# Patient Record
Sex: Male | Born: 1970 | Race: White | Hispanic: No | Marital: Married | Smoking: Current every day smoker
Health system: Southern US, Community
[De-identification: ages and names within clinical notes are randomized; demographics above are authoritative.]

## PROBLEM LIST (undated history)

## (undated) DIAGNOSIS — IMO0001 Reserved for inherently not codable concepts without codable children: Secondary | ICD-10-CM

## (undated) DIAGNOSIS — S53106A Unspecified dislocation of unspecified ulnohumeral joint, initial encounter: Secondary | ICD-10-CM

## (undated) DIAGNOSIS — F419 Anxiety disorder, unspecified: Secondary | ICD-10-CM

## (undated) DIAGNOSIS — J45909 Unspecified asthma, uncomplicated: Secondary | ICD-10-CM

## (undated) DIAGNOSIS — I10 Essential (primary) hypertension: Secondary | ICD-10-CM

## (undated) DIAGNOSIS — E7800 Pure hypercholesterolemia, unspecified: Secondary | ICD-10-CM

## (undated) DIAGNOSIS — R748 Abnormal levels of other serum enzymes: Secondary | ICD-10-CM

## (undated) DIAGNOSIS — T7840XA Allergy, unspecified, initial encounter: Secondary | ICD-10-CM

## (undated) DIAGNOSIS — F1910 Other psychoactive substance abuse, uncomplicated: Secondary | ICD-10-CM

## (undated) HISTORY — DX: Unspecified asthma, uncomplicated: J45.909

## (undated) HISTORY — DX: Other psychoactive substance abuse, uncomplicated: F19.10

## (undated) HISTORY — DX: Allergy, unspecified, initial encounter: T78.40XA

## (undated) HISTORY — DX: Abnormal levels of other serum enzymes: R74.8

## (undated) HISTORY — DX: Essential (primary) hypertension: I10

---
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
## 2005-01-17 ENCOUNTER — Emergency Department: Payer: Self-pay | Admitting: Unknown Physician Specialty

## 2006-12-14 ENCOUNTER — Ambulatory Visit: Payer: Self-pay | Admitting: Otolaryngology

## 2009-12-31 ENCOUNTER — Ambulatory Visit: Payer: Self-pay

## 2010-01-25 HISTORY — PX: TYMPANOMASTOIDECTOMY WITH RECONSTRUCTION: SHX5679

## 2010-02-19 ENCOUNTER — Ambulatory Visit: Payer: Self-pay | Admitting: Otolaryngology

## 2010-03-11 ENCOUNTER — Ambulatory Visit: Payer: Self-pay | Admitting: Otolaryngology

## 2012-02-25 ENCOUNTER — Ambulatory Visit: Payer: Self-pay | Admitting: Family Medicine

## 2012-06-28 ENCOUNTER — Ambulatory Visit: Payer: Self-pay | Admitting: Otolaryngology

## 2012-10-16 IMAGING — CT CT ORBITS WITHOUT CONTRAST
3 of 4 series · 17 of 30 positions shown, 19 images · non-contrast
Comparison: none

REASON FOR EXAM: Cholesteatoma Left Compare to Previous CT November 2006
COMMENTS:

PROCEDURE:     CT  - CT ORBITS OR TEMPORAL BONE WO  - February 19, 2010  [DATE]
RESULT:     Comparison is made to a prior study dated 02/12/2006.
TECHNIQUE: Axial and coronal imaging of the temporal bones was obtained
utilizing helical 0.75 mm acquisition and bone reconstruction algorithm.

[Series 5: left coronal temp bone · axial · 0.20mm/px · z∈[+283,+324]mm · 6 of 78 slices shown, 8 images]
[im 12/78  brain]
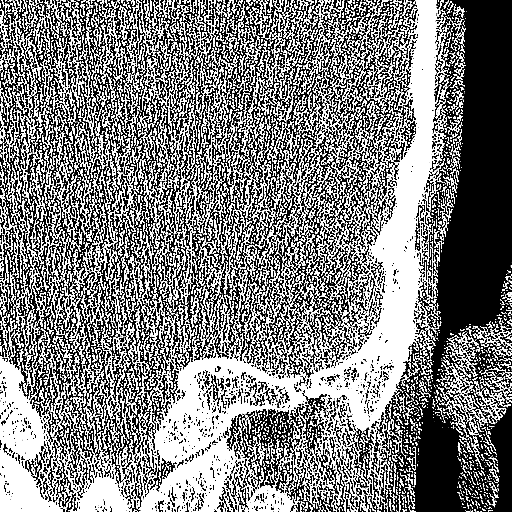
[im 12/78  bone]
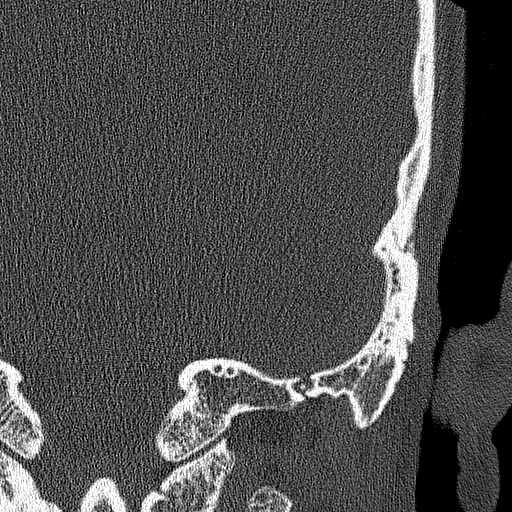
[im 23/78  bone]
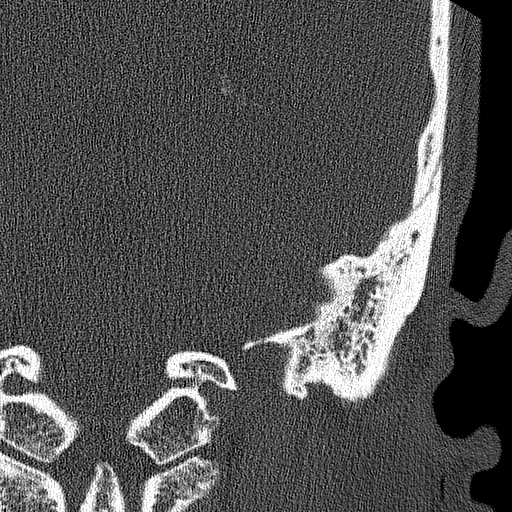
[im 34/78  bone]
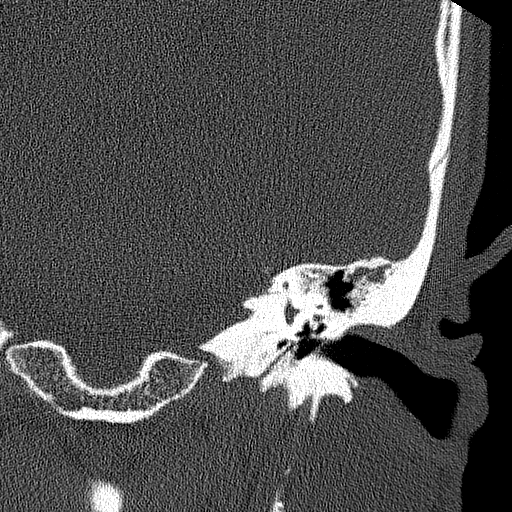
[im 45/78  bone]
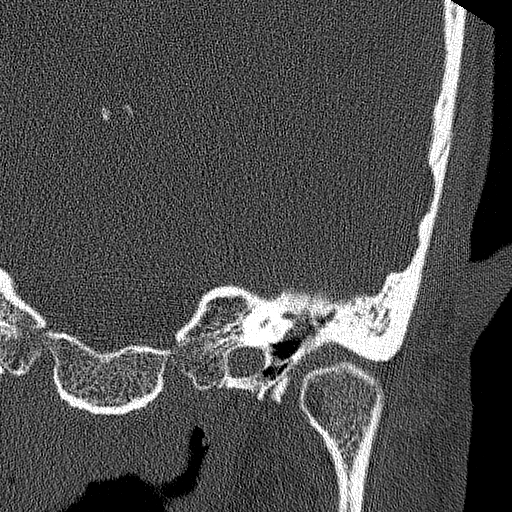
[im 56/78  brain]
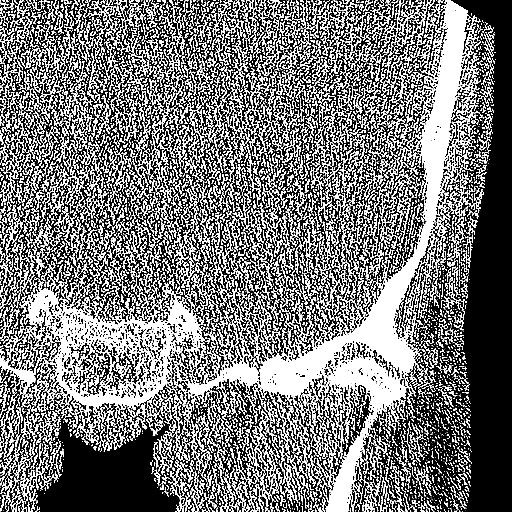
[im 56/78  bone]
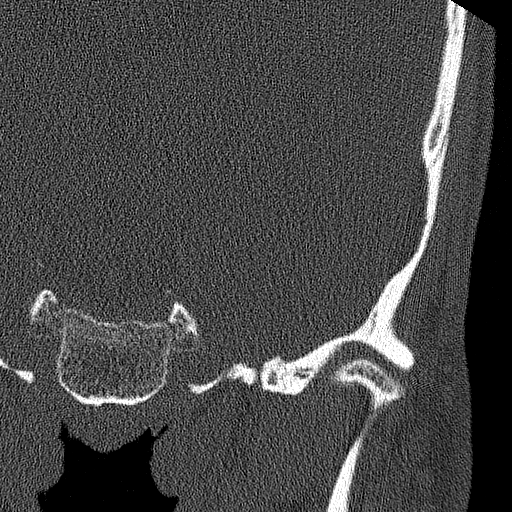
[im 67/78  bone]
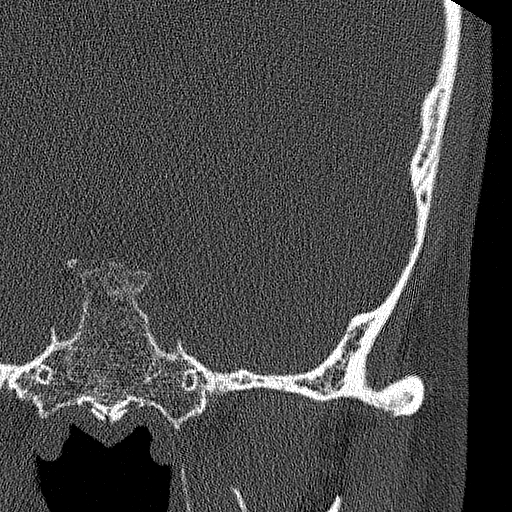

[Series 6: right coronal temp bone · axial · 0.20mm/px · z∈[+283,+324]mm · 6 of 78 slices shown]
[im 12/78  bone]
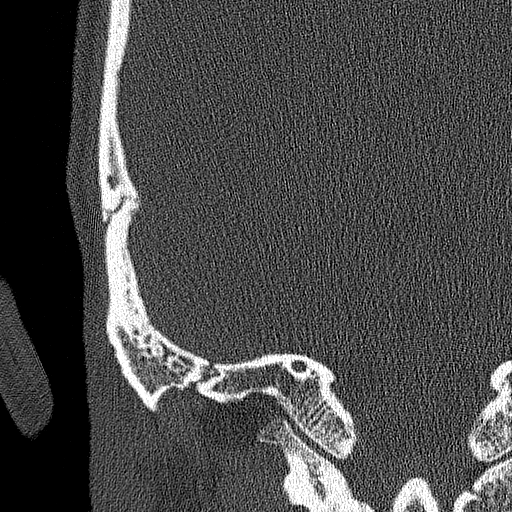
[im 23/78  bone]
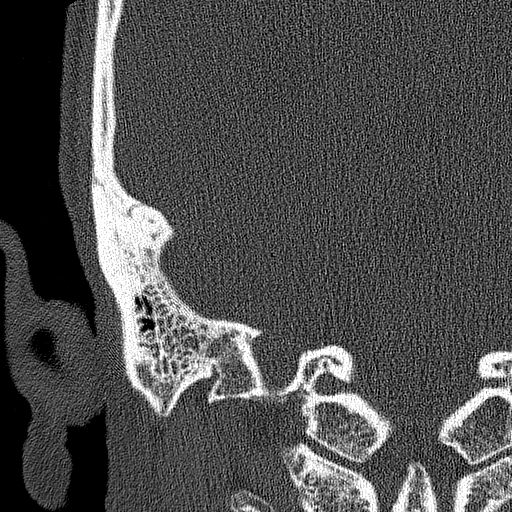
[im 34/78  bone]
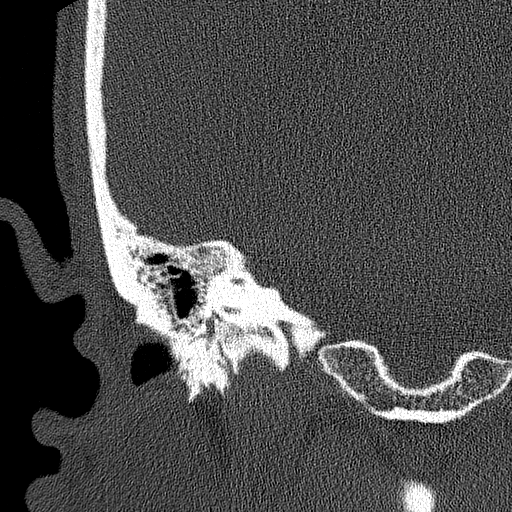
[im 45/78  bone]
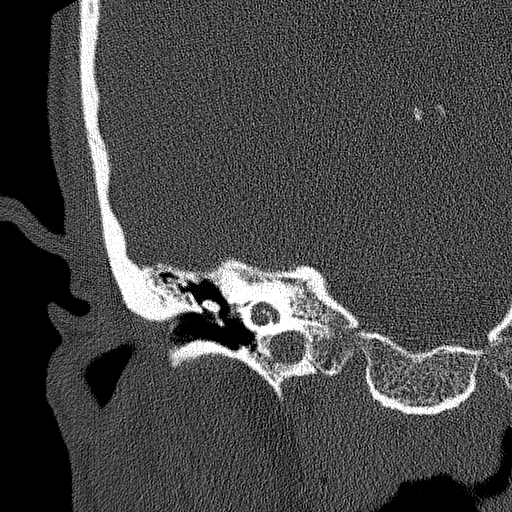
[im 56/78  bone]
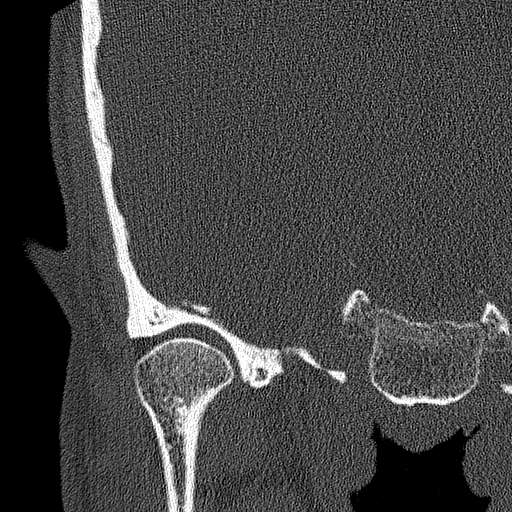
[im 67/78  bone]
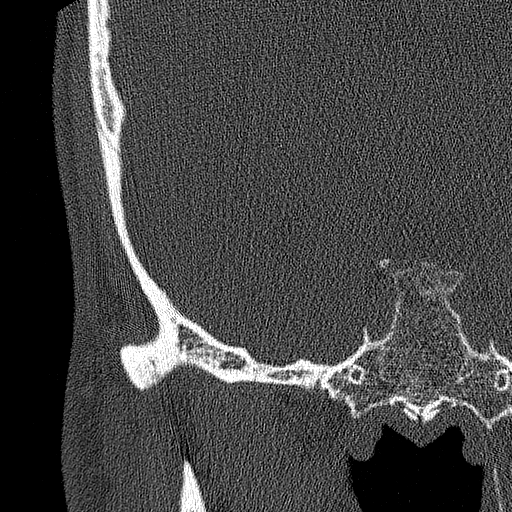

[Series 7: right axial temp bones · axial · 0.20mm/px · z∈[+331,+363]mm · 5 of 70 slices shown]
[im 12/70  bone]
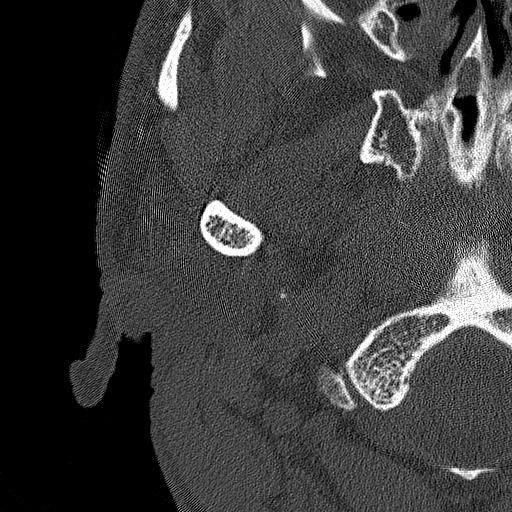
[im 24/70  bone]
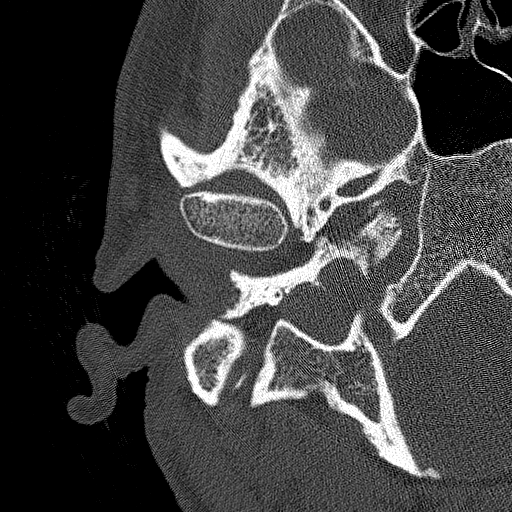
[im 35/70  bone]
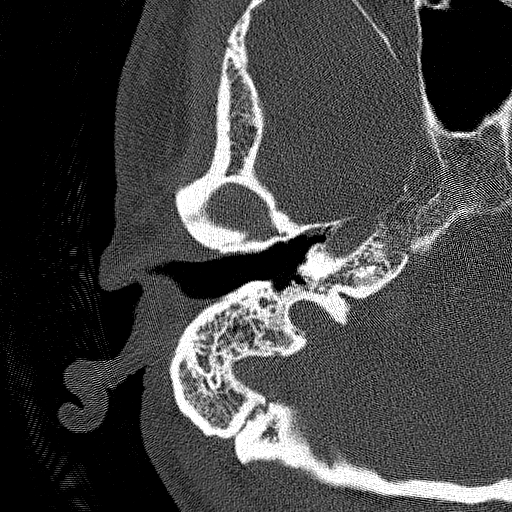
[im 47/70  bone]
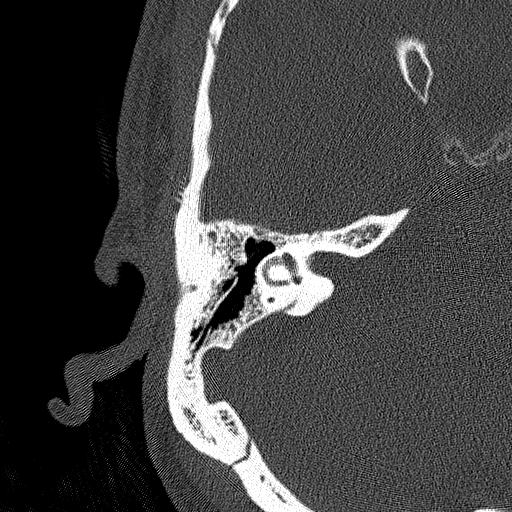
[im 58/70  bone]
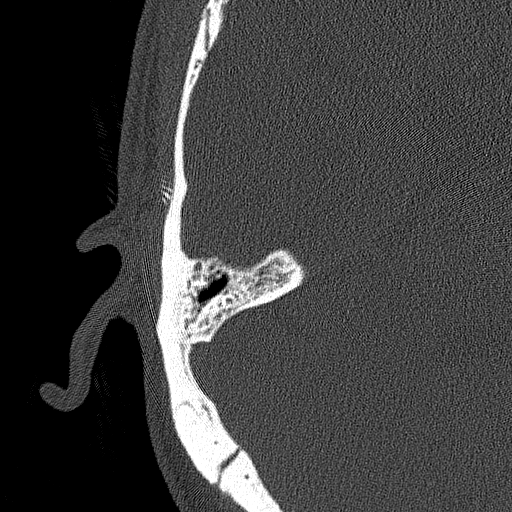

[17 of 30 positions shown; findings below may reference images not displayed]

FINDINGS: Evaluation of the right temporal bone once again demonstrates a
small area of soft tissue attenuation projecting within the medial aspect of
the middle ear cavity adjacent to the cochlear. The ossicles appear intact
as well as the tympanic membrane. There does not appear to be evidence of
scutal erosion. The tegmen is intact. There are mild soft tissue densities
within the mastoid air cells.

Evaluation of the left tympanic membrane once again demonstrates soft tissue
attenuation within the middle air cavity. This extends into the epitympanic
region. Traction of the tympanic membrane is identified as well as evidence
of mild to moderate scutal erosion. These findings are unchanged. There is
mild ossicular irregularity which again is unchanged. The tegmen is
unchanged. Areas of soft tissue attenuation are also appreciated within the
mastoid air cells. These findings are unchanged.
IMPRESSION: Areas of soft tissue density once again are appreciated within the right and
left middle ear cavities again concerning for cholesteatomas. These findings
are unchanged when compared to the previous study dated 12/14/2006.

## 2013-01-25 HISTORY — PX: ANTERIOR CRUCIATE LIGAMENT REPAIR: SHX115

## 2013-04-11 DIAGNOSIS — T8489XA Other specified complication of internal orthopedic prosthetic devices, implants and grafts, initial encounter: Secondary | ICD-10-CM | POA: Insufficient documentation

## 2013-05-30 ENCOUNTER — Encounter: Payer: Self-pay | Admitting: Orthopedic Surgery

## 2013-06-25 ENCOUNTER — Encounter: Payer: Self-pay | Admitting: Orthopedic Surgery

## 2013-07-25 ENCOUNTER — Encounter: Payer: Self-pay | Admitting: Orthopedic Surgery

## 2013-08-25 ENCOUNTER — Encounter: Payer: Self-pay | Admitting: Orthopedic Surgery

## 2014-07-13 ENCOUNTER — Other Ambulatory Visit: Payer: Self-pay | Admitting: Family Medicine

## 2014-09-16 DIAGNOSIS — T85848A Pain due to other internal prosthetic devices, implants and grafts, initial encounter: Secondary | ICD-10-CM | POA: Insufficient documentation

## 2014-09-25 ENCOUNTER — Other Ambulatory Visit: Payer: Self-pay | Admitting: Family Medicine

## 2014-10-10 ENCOUNTER — Telehealth: Payer: Self-pay | Admitting: Family Medicine

## 2014-10-10 NOTE — Telephone Encounter (Signed)
Symb[facility], Pt has no more refills.

## 2014-10-22 IMAGING — CR DG CHEST 2V
1 series · 2 of 2 positions shown · non-contrast
Comparison: none

REASON FOR EXAM: cough
COMMENTS:

[Series 1: pa · 0.17mm/px · 2 of 2 slices shown]
[im 1/2]
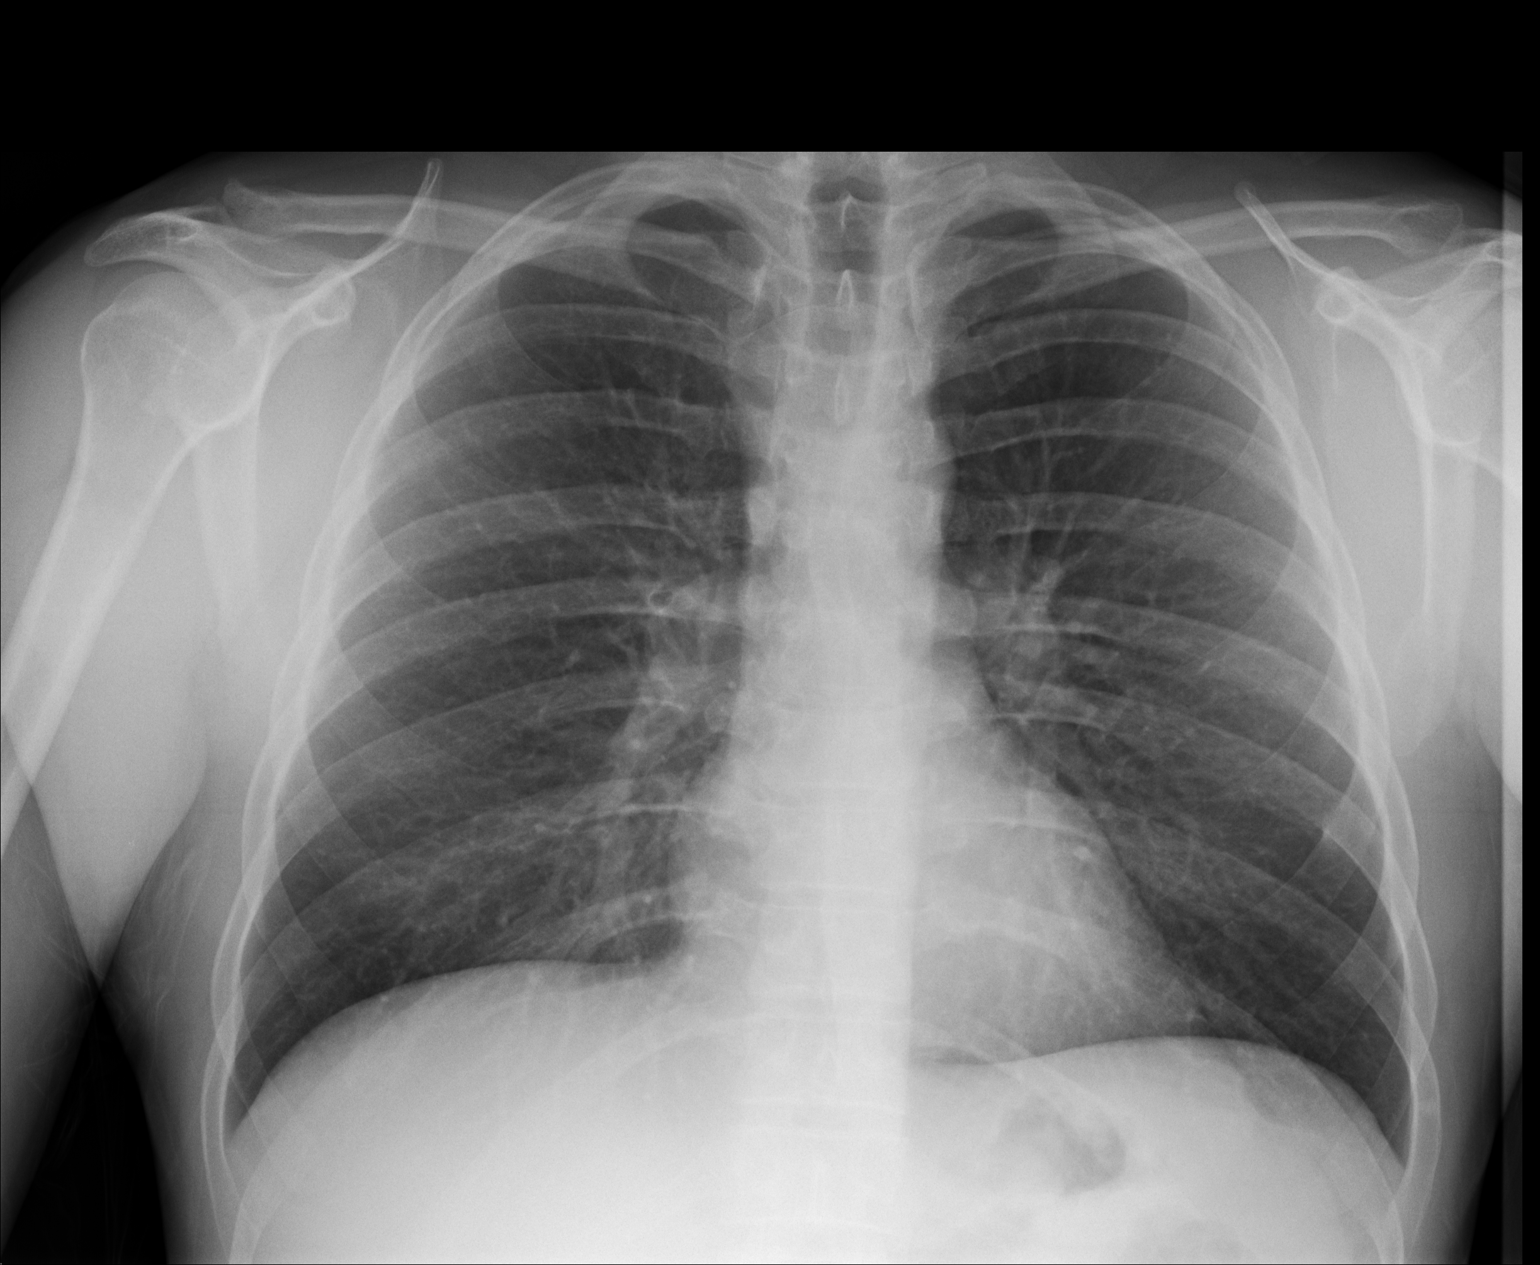
[im 2/2]
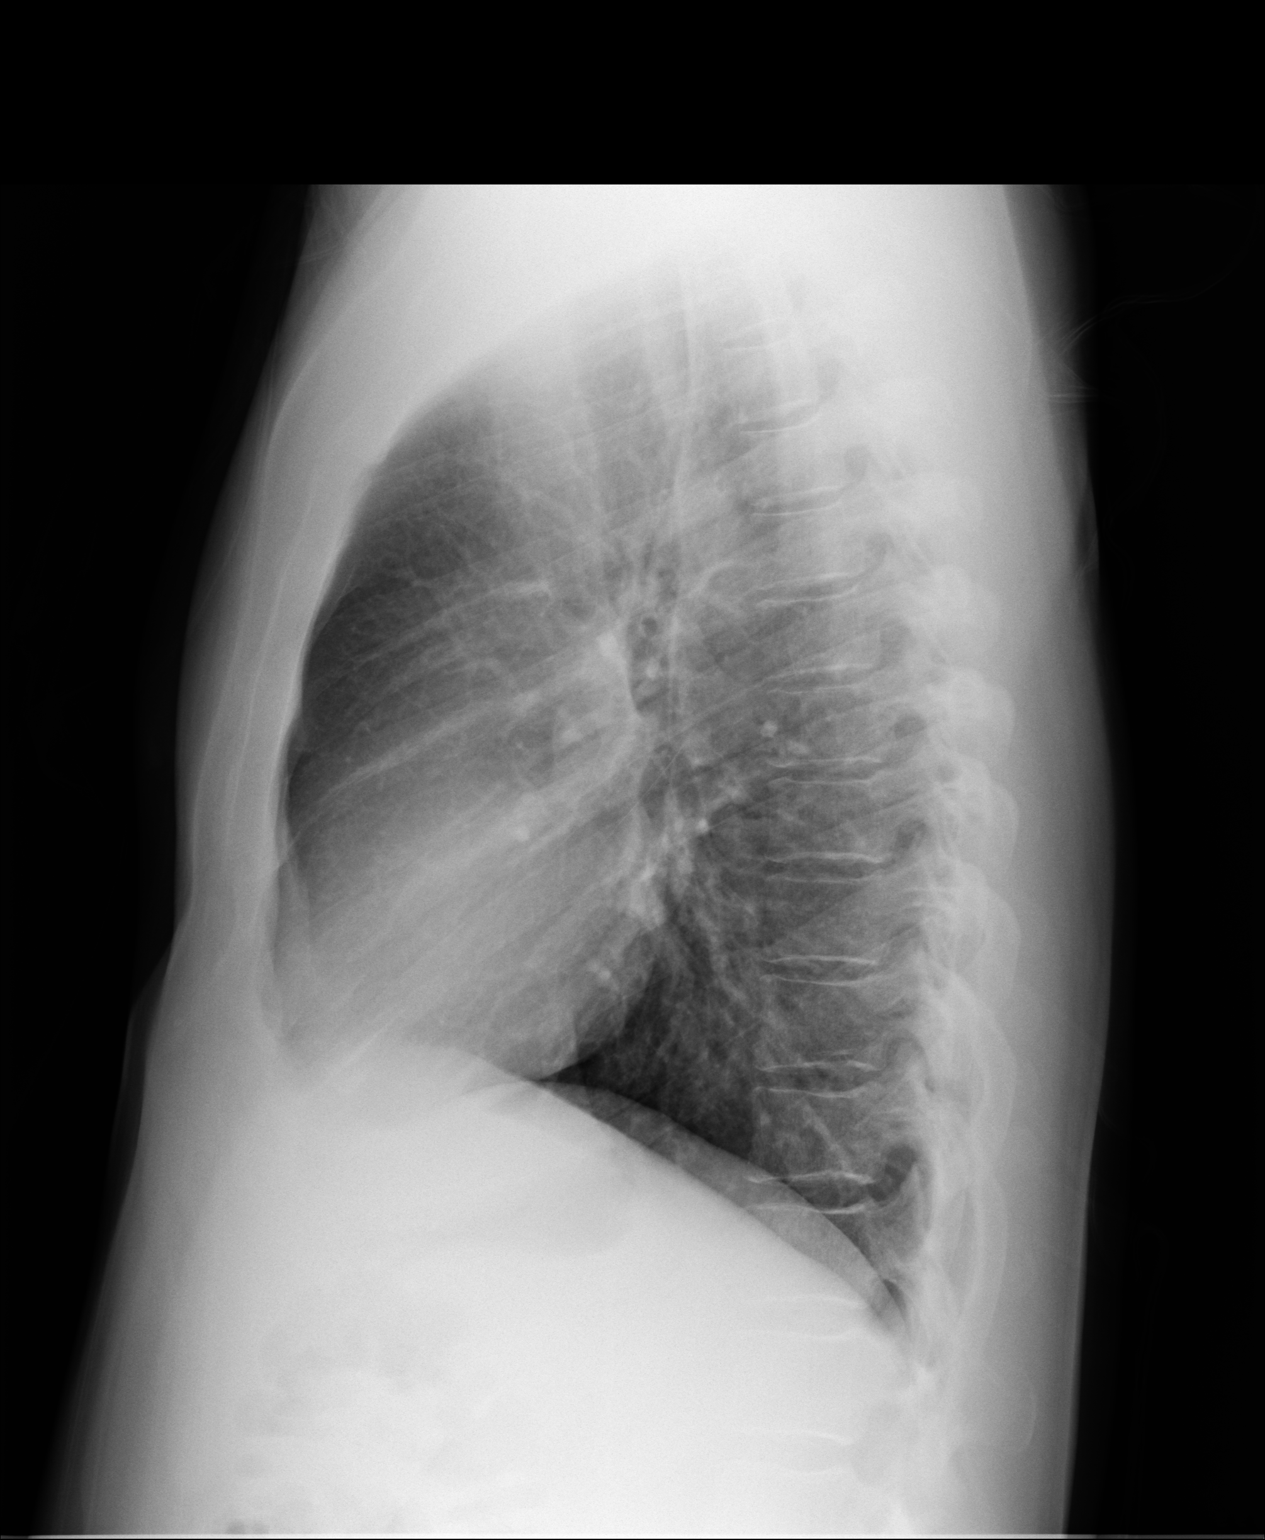

[2 of 2 positions shown; findings below may reference images not displayed]

PROCEDURE:     THACKER - THACKER CHEST PA (OR AP) AND LAT  - February 25, 2012 [DATE]

RESULT:     The lungs are mildly hyperinflated. There is no focal
infiltrate. The cardiac silhouette is normal in size. The mediastinum is
normal in width. The perihilar lung markings are mildly prominent. There is
no pleural effusion. The pulmonary vascularity is not engorged. The bony
structures exhibit no acute abnormalities.
IMPRESSION: There is no evidence of pneumonia. I cannot exclude acute
bronchitis superimposed upon reactive airway disease or COPD in the
appropriate clinical setting.

[REDACTED]

## 2014-11-20 ENCOUNTER — Encounter: Payer: Self-pay | Admitting: Family Medicine

## 2014-11-20 ENCOUNTER — Ambulatory Visit (INDEPENDENT_AMBULATORY_CARE_PROVIDER_SITE_OTHER): Payer: BLUE CROSS/BLUE SHIELD | Admitting: Family Medicine

## 2014-11-20 VITALS — BP 118/74 | HR 64 | Temp 98.0°F | Ht 66.6 in | Wt 149.0 lb

## 2014-11-20 DIAGNOSIS — J45909 Unspecified asthma, uncomplicated: Secondary | ICD-10-CM | POA: Diagnosis not present

## 2014-11-20 DIAGNOSIS — M545 Low back pain, unspecified: Secondary | ICD-10-CM | POA: Insufficient documentation

## 2014-11-20 LAB — URINALYSIS, ROUTINE W REFLEX MICROSCOPIC
Bilirubin, UA: NEGATIVE
Glucose, UA: NEGATIVE
KETONES UA: NEGATIVE
LEUKOCYTES UA: NEGATIVE
Nitrite, UA: NEGATIVE
Protein, UA: NEGATIVE
RBC, UA: NEGATIVE
Specific Gravity, UA: 1.025 (ref 1.005–1.030)
Urobilinogen, Ur: 0.2 mg/dL (ref 0.2–1.0)
pH, UA: 5.5 (ref 5.0–7.5)

## 2014-11-20 MED ORDER — ALBUTEROL SULFATE HFA 108 (90 BASE) MCG/ACT IN AERS: 2.0000 | INHALATION_SPRAY | Freq: Four times a day (QID) | RESPIRATORY_TRACT | Status: DC | PRN

## 2014-11-20 MED ORDER — MELOXICAM 15 MG PO TABS: 15.0000 mg | ORAL_TABLET | Freq: Every day | ORAL | Status: DC

## 2014-11-20 MED ORDER — CYCLOBENZAPRINE HCL 5 MG PO TABS: 5.0000 mg | ORAL_TABLET | Freq: Three times a day (TID) | ORAL | Status: DC | PRN

## 2014-11-20 NOTE — Progress Notes (Signed)
BP 118/74 mmHg  Pulse 64  Temp(Src) 98 F (36.7 C)  Ht 5' 6.6" (1.692 m)  Wt 149 lb (67.586 kg)  BMI 23.61 kg/m2  SpO2 97%   Subjective:    Patient ID: Daniel Lawson, male    DOB: [birth year], 44 y.o.   MRN: [id number]  HPI: Daniel Lawson is a 44 y.o. male  Chief Complaint  Patient presents with  . Medication Refill  . Back Pain   patient with several concerns Asthma attack with severe coughing wheezing hasn't had any for years wants to have an inhaler. Since has done fine did not have any known triggers. 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 Patient also has low back pain no radiation worse with bending and stooping coming up from lifting. No blood in stool or urine.   Relevant past medical, surgical, family and social history reviewed and updated as indicated. Interim medical history since our last visit reviewed. Allergies and medications reviewed and updated.  Review of Systems  Constitutional: Negative.   Respiratory: Negative.   Cardiovascular: Negative.     Per HPI unless specifically indicated above     Objective:    BP 118/74 mmHg  Pulse 64  Temp(Src) 98 F (36.7 C)  Ht 5' 6.6" (1.692 m)  Wt 149 lb (67.586 kg)  BMI 23.61 kg/m2  SpO2 97%  Wt Readings from Last 3 Encounters:  11/20/14 149 lb (67.586 kg)    Physical Exam  Constitutional: He is oriented to person, place, and time. He appears well-developed and well-nourished. No distress.  HENT:  Head: Normocephalic and atraumatic.  Right Ear: Hearing normal.  Left Ear: Hearing normal.  Nose: Nose normal.  Eyes: Conjunctivae and lids are normal. Right eye exhibits no discharge. Left eye exhibits no discharge. No scleral icterus.  Cardiovascular: Normal rate, regular rhythm and normal heart sounds.   Pulmonary/Chest: Effort normal and breath sounds normal. No respiratory  distress.  Musculoskeletal: Normal range of motion.  Right trapezius area with resistance to activity able to light up trigger point. Low back with normal DTRs straight leg raising no neurological deficit but did with bending aggravate low back Urinalysis was normal with no evidence of blood.  Neurological: He is alert and oriented to person, place, and time.  Skin: Skin is intact. No rash noted.  Psychiatric: He has a normal mood and affect. His speech is normal and behavior is normal. Judgment and thought content normal. Cognition and memory are normal.        Assessment & Plan:   Problem List Items Addressed This Visit      Respiratory   Asthma    Discuss asthma care triggers leading smoking Give prescription for albuterol inhaler      Relevant Medications   albuterol (PROVENTIL HFA;VENTOLIN HFA) 108 (90 BASE) MCG/ACT inhaler     Other   Right-sided low back pain without sciatica - Primary    Discussed trigger point care stretching Changing activities trying to use left hand for Discuss low back pain posture Proper lifting techniques sharing lifting duties Massage therapy Discuss use of muscle relaxers only at the end of the day due to drowsiness side effects May use meloxicam sparingly      Relevant Medications   meloxicam (MOBIC) 15 MG tablet   cyclobenzaprine (FLEXERIL) 5 MG tablet   Other Relevant Orders  Urinalysis, Routine w reflex microscopic (not [facility])       Follow up plan: Return for Physical Exam in February.

## 2014-11-20 NOTE — Assessment & Plan Note (Signed)
Discuss asthma care triggers leading smoking Give prescription for albuterol inhaler

## 2014-11-20 NOTE — Assessment & Plan Note (Signed)
Discussed trigger point care stretching Changing activities trying to use left hand for Discuss low back pain posture Proper lifting techniques sharing lifting duties Massage therapy Discuss use of muscle relaxers only at the end of the day due to drowsiness side effects May use meloxicam sparingly

## 2015-01-21 ENCOUNTER — Encounter: Payer: Self-pay | Admitting: Family Medicine

## 2015-01-21 ENCOUNTER — Ambulatory Visit (INDEPENDENT_AMBULATORY_CARE_PROVIDER_SITE_OTHER): Payer: BLUE CROSS/BLUE SHIELD | Admitting: Family Medicine

## 2015-01-21 VITALS — BP 126/75 | HR 66 | Temp 98.1°F | Resp 16 | Ht 66.5 in | Wt 151.0 lb

## 2015-01-21 DIAGNOSIS — R059 Cough, unspecified: Secondary | ICD-10-CM

## 2015-01-21 DIAGNOSIS — R509 Fever, unspecified: Secondary | ICD-10-CM

## 2015-01-21 DIAGNOSIS — R5383 Other fatigue: Secondary | ICD-10-CM | POA: Diagnosis not present

## 2015-01-21 DIAGNOSIS — J111 Influenza due to unidentified influenza virus with other respiratory manifestations: Secondary | ICD-10-CM

## 2015-01-21 DIAGNOSIS — R05 Cough: Secondary | ICD-10-CM | POA: Insufficient documentation

## 2015-01-21 DIAGNOSIS — R69 Illness, unspecified: Secondary | ICD-10-CM

## 2015-01-21 LAB — POC INFLUENZA A&B (BINAX/QUICKVUE)
Influenza A, POC: NEGATIVE
Influenza B, POC: NEGATIVE

## 2015-01-21 MED ORDER — AZITHROMYCIN 250 MG PO TABS: ORAL_TABLET | ORAL | Status: DC

## 2015-01-21 MED ORDER — DM-GUAIFENESIN ER 30-600 MG PO TB12: 1.0000 | ORAL_TABLET | Freq: Two times a day (BID) | ORAL | Status: DC | PRN

## 2015-01-21 MED ORDER — OSELTAMIVIR PHOSPHATE 75 MG PO CAPS: 75.0000 mg | ORAL_CAPSULE | Freq: Two times a day (BID) | ORAL | Status: DC

## 2015-01-21 NOTE — Progress Notes (Signed)
Name: Daniel Lawson   MRN: [id number]    DOB: 04/26/1970   Date:01/21/2015       Progress Note  Subjective  Chief Complaint  Chief Complaint  Patient presents with  . Acute Visit    Flu    HPI 3 days of fatigue, aches, chills, fever, headache, ear pain, cough (non-productive), and mild sore throat.  Sudden onset.  He did not get a flu shot this year.  No problem-specific assessment & plan notes found for this encounter.   Past Medical History  Diagnosis Date  . Substance abuse     alcohol  . Elevated liver enzymes   . Hypertension   . Allergy   . Asthma     Social History  Substance Use Topics  . Smoking status: Current Every Day Smoker    Types: Cigarettes  . Smokeless tobacco: Never Used  . Alcohol Use: No     Current outpatient prescriptions:  .  albuterol (PROVENTIL HFA;VENTOLIN HFA) 108 (90 BASE) MCG/ACT inhaler, Inhale 2 puffs into the lungs every 6 (six) hours as needed for wheezing or shortness of breath., Disp: 1 Inhaler, Rfl: 0 .  cyclobenzaprine (FLEXERIL) 5 MG tablet, Take 1 tablet (5 mg total) by mouth 3 (three) times daily as needed for muscle spasms., Disp: 30 tablet, Rfl: 1 .  FLUoxetine (PROZAC) 20 MG tablet, TAKE ONE TABLET BY MOUTH ONCE DAILY, Disp: 30 tablet, Rfl: 6 .  meloxicam (MOBIC) 15 MG tablet, Take 1 tablet (15 mg total) by mouth daily., Disp: 30 tablet, Rfl: 3  No Known Allergies  Review of Systems  Constitutional: Positive for fever, chills and malaise/fatigue. Negative for weight loss.  HENT: Negative for hearing loss.   Eyes: Negative for blurred vision and double vision.  Respiratory: Positive for cough. Negative for shortness of breath and wheezing.   Cardiovascular: Negative for chest pain, palpitations and leg swelling.  Gastrointestinal: Negative for heartburn, abdominal pain and blood in stool.  Genitourinary: Negative for dysuria, urgency and frequency.  Musculoskeletal: Positive for myalgias. Negative for joint pain.   Skin: Negative for rash.  Neurological: Positive for weakness and headaches. Negative for dizziness and tremors.      Objective  Filed Vitals:   01/21/15 0957  BP: 126/75  Pulse: 66  Temp: 98.1 F (36.7 C)  TempSrc: Oral  Resp: 16  Height: 5' 6.5" (1.689 m)  Weight: 151 lb (68.493 kg)     Physical Exam  Constitutional: He is oriented to person, place, and time. He appears distressed (Appears to feel ill.).  HENT:  Head: Normocephalic and atraumatic.  Right Ear: External ear normal.  Left Ear: External ear normal.  Nose: Nose normal.  Mouth/Throat: Oropharynx is clear and moist.  Neck: Normal range of motion. Neck supple. Carotid bruit is not present. No thyromegaly present.  Cardiovascular: Normal rate, regular rhythm and normal heart sounds.  Exam reveals no gallop and no friction rub.   No murmur heard. Pulmonary/Chest: Effort normal. No respiratory distress. He has wheezes. He has no rales.  Lymphadenopathy:    He has no cervical adenopathy.  Neurological: He is alert and oriented to person, place, and time.  Vitals reviewed.     Recent Results (from the past 2160 hour(s))  Urinalysis, Routine w reflex microscopic (not at [facility])     Status: None   Collection Time: 11/20/14 10:37 AM  Result Value Ref Range   Specific Gravity, UA 1.025 1.005 - 1.030   pH, UA 5.5 5.0 -  7.5   Color, UA Yellow Yellow   Appearance Ur Clear Clear   Leukocytes, UA Negative Negative   Protein, UA Negative Negative/Trace   Glucose, UA Negative Negative   Ketones, UA Negative Negative   RBC, UA Negative Negative   Bilirubin, UA Negative Negative   Urobilinogen, Ur 0.2 0.2 - 1.0 mg/dL   Nitrite, UA Negative Negative  POC Influenza A&B (Binax test)     Status: Normal   Collection Time: 01/21/15 10:33 AM  Result Value Ref Range   Influenza A, POC Negative Negative   Influenza B, POC Negative Negative     Assessment & Plan  1. Fever and chills  - POC Influenza A&B (Binax  test)-neg 2. Influenza-like illness  - oseltamivir (TAMIFLU) 75 MG capsule; Take 1 capsule (75 mg total) by mouth 2 (two) times daily.  Dispense: 10 capsule; Refill: 0  3. Cough   - azithromycin (ZITHROMAX) 250 MG tablet; Take 2 tabs on day 1, then 1 tab daily on days 2-5  Dispense: 6 tablet; Refill: 0 - dextromethorphan-guaiFENesin (MUCINEX DM) 30-600 MG 12hr tablet; Take 1 tablet by mouth 2 (two) times daily as needed for cough.  Dispense: 20 tablet; Refill: 1  4. Other fatigue

## 2015-01-29 ENCOUNTER — Other Ambulatory Visit: Payer: Self-pay

## 2015-01-29 MED ORDER — FLUOXETINE HCL 20 MG PO TABS: 20.0000 mg | ORAL_TABLET | Freq: Every day | ORAL | Status: DC

## 2015-01-29 NOTE — Telephone Encounter (Signed)
[facility]: Refill request W/ 90 day supply.  Last Visit: 11/20/2014 Next Appt: 03/19/2015  Request for fluoxetine 20mg  tablet #90

## 2015-03-19 ENCOUNTER — Ambulatory Visit
Admission: RE | Admit: 2015-03-19 | Discharge: 2015-03-19 | Disposition: A | Discharge status: Home / Self Care | Payer: BLUE CROSS/BLUE SHIELD | Source: Ambulatory Visit | Attending: Family Medicine | Admitting: Family Medicine

## 2015-03-19 ENCOUNTER — Ambulatory Visit (INDEPENDENT_AMBULATORY_CARE_PROVIDER_SITE_OTHER): Payer: BLUE CROSS/BLUE SHIELD | Admitting: Family Medicine

## 2015-03-19 ENCOUNTER — Encounter: Payer: Self-pay | Admitting: Family Medicine

## 2015-03-19 VITALS — BP 118/73 | HR 56 | Temp 98.3°F | Ht 66.6 in | Wt 154.0 lb

## 2015-03-19 DIAGNOSIS — Z0000 Encounter for general adult medical examination without abnormal findings: Secondary | ICD-10-CM

## 2015-03-19 DIAGNOSIS — Z113 Encounter for screening for infections with a predominantly sexual mode of transmission: Secondary | ICD-10-CM

## 2015-03-19 DIAGNOSIS — J45909 Unspecified asthma, uncomplicated: Secondary | ICD-10-CM | POA: Diagnosis not present

## 2015-03-19 DIAGNOSIS — F32A Depression, unspecified: Secondary | ICD-10-CM

## 2015-03-19 DIAGNOSIS — F329 Major depressive disorder, single episode, unspecified: Secondary | ICD-10-CM | POA: Diagnosis not present

## 2015-03-19 LAB — URINALYSIS, ROUTINE W REFLEX MICROSCOPIC
BILIRUBIN UA: NEGATIVE
GLUCOSE, UA: NEGATIVE
KETONES UA: NEGATIVE
Leukocytes, UA: NEGATIVE
Nitrite, UA: NEGATIVE
PROTEIN UA: NEGATIVE
RBC UA: NEGATIVE
SPEC GRAV UA: 1.01 (ref 1.005–1.030)
UUROB: 0.2 mg/dL (ref 0.2–1.0)
pH, UA: 7.5 (ref 5.0–7.5)

## 2015-03-19 MED ORDER — MELOXICAM 15 MG PO TABS: 15.0000 mg | ORAL_TABLET | Freq: Every day | ORAL | Status: DC

## 2015-03-19 MED ORDER — FLUOXETINE HCL 20 MG PO TABS: 20.0000 mg | ORAL_TABLET | Freq: Every day | ORAL | Status: DC

## 2015-03-19 MED ORDER — UMECLIDINIUM-VILANTEROL 62.5-25 MCG/INH IN AEPB: 1.0000 | INHALATION_SPRAY | Freq: Every day | RESPIRATORY_TRACT | Status: DC

## 2015-03-19 MED ORDER — ALBUTEROL SULFATE HFA 108 (90 BASE) MCG/ACT IN AERS: 2.0000 | INHALATION_SPRAY | Freq: Four times a day (QID) | RESPIRATORY_TRACT | Status: DC | PRN

## 2015-03-19 NOTE — Assessment & Plan Note (Signed)
The current medical regimen is effective;  continue present plan and medications.  

## 2015-03-19 NOTE — Addendum Note (Signed)
Addended by: Lurlean Horns on: 03/19/2015 09:57 AM   Modules accepted: Kipp Brood

## 2015-03-19 NOTE — Progress Notes (Signed)
BP 118/73 mmHg  Pulse 56  Temp(Src) 98.3 F (36.8 C)  Ht 5' 6.6" (1.692 m)  Wt 154 lb (69.854 kg)  BMI 24.40 kg/m2  SpO2 98%   Subjective:    Patient ID: Daniel Lawson, male    DOB: [birth year], 45 y.o.   MRN: [id number]  HPI: ZAKI Lawson is a 45 y.o. male  Chief Complaint  Patient presents with  . 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 Cough dry nonproductive, with some wheezing. Still smoking about half pack a day Takes fluoxetine 20 every day nerves doing well Taking meloxicam also without problems takes that every morning. Loosens up his muscles and back   Relevant past medical, surgical, family and social history reviewed and updated as indicated. Interim medical history since our last visit reviewed. Allergies and medications reviewed and updated.  Review of Systems  Constitutional: Negative.   HENT: Negative.   Eyes: Negative.   Respiratory: Negative.   Cardiovascular: Negative.   Gastrointestinal: Negative.   Endocrine: Negative.   Genitourinary: Negative.   Musculoskeletal: Negative.   Skin: Negative.   Allergic/Immunologic: Negative.   Neurological: Negative.   Hematological: Negative.   Psychiatric/Behavioral: Negative.     Per HPI unless specifically indicated above     Objective:    BP 118/73 mmHg  Pulse 56  Temp(Src) 98.3 F (36.8 C)  Ht 5' 6.6" (1.692 m)  Wt 154 lb (69.854 kg)  BMI 24.40 kg/m2  SpO2 98%  Wt Readings from Last 3 Encounters:  03/19/15 154 lb (69.854 kg)  01/21/15 151 lb (68.493 kg)  11/20/14 149 lb (67.586 kg)    Physical Exam  Constitutional: He is oriented to person, place, and time. He appears well-developed and well-nourished.  HENT:  Head: Normocephalic and atraumatic.  Right Ear: External ear normal.  Left Ear: External ear normal.  Eyes:  Conjunctivae and EOM are normal. Pupils are equal, round, and reactive to light.  Neck: Normal range of motion. Neck supple.  Cardiovascular: Normal rate, regular rhythm, normal heart sounds and intact distal pulses.   Pulmonary/Chest: Effort normal and breath sounds normal.  Abdominal: Soft. Bowel sounds are normal. There is no splenomegaly or hepatomegaly.  Genitourinary: Rectum normal, prostate normal and penis normal.  Musculoskeletal: Normal range of motion.  Neurological: He is alert and oriented to person, place, and time. He has normal reflexes.  Skin: No rash noted. No erythema.  Psychiatric: He has a normal mood and affect. His behavior is normal. Judgment and thought content normal.    Results for orders placed or performed in visit on 01/21/15  POC Influenza A&B (Binax test)  Result Value Ref Range   Influenza A, POC Negative Negative   Influenza B, POC Negative Negative      Assessment & Plan:   Problem List Items Addressed This Visit      Respiratory   Asthma    Patient with chronic cough for 6 months we'll get chest x-ray because of exposure to dusty environment with potential for asbestosis Will start Anoro patient had given      Relevant Medications   albuterol (PROVENTIL HFA;VENTOLIN HFA) 108 (90 Base) MCG/ACT inhaler   umeclidinium-vilanterol (ANORO ELLIPTA) 62.5-25 MCG/INH AEPB   Other Relevant Orders   DG Chest 2 View     Other   Depression  The current medical regimen is effective;  continue present plan and medications.       Relevant Medications   FLUoxetine (PROZAC) 20 MG tablet    Other Visit Diagnoses    Routine general medical examination at a health care facility    -  Primary    Relevant Orders    CBC with Differential/Platelet    Comprehensive metabolic panel    Lipid Panel w/o Chol/HDL Ratio    PSA    TSH    Urinalysis, Routine w reflex microscopic (not at [facility])    Routine screening for STI (sexually transmitted infection)         Relevant Orders    HIV antibody        Follow up plan: Return in about 6 months (around 09/16/2015) for med check.

## 2015-03-19 NOTE — Assessment & Plan Note (Signed)
Patient with chronic cough for 6 months we'll get chest x-ray because of exposure to dusty environment with potential for asbestosis Will start Anoro patient had given

## 2015-03-20 ENCOUNTER — Telehealth: Payer: Self-pay | Admitting: Family Medicine

## 2015-03-20 LAB — COMPREHENSIVE METABOLIC PANEL
A/G RATIO: 2 (ref 1.1–2.5)
ALK PHOS: 55 IU/L (ref 39–117)
ALT: 18 IU/L (ref 0–44)
AST: 15 IU/L (ref 0–40)
Albumin: 4.7 g/dL (ref 3.5–5.5)
BUN/Creatinine Ratio: 13 (ref 9–20)
BUN: 14 mg/dL (ref 6–24)
Bilirubin Total: 0.4 mg/dL (ref 0.0–1.2)
CALCIUM: 9.8 mg/dL (ref 8.7–10.2)
CO2: 25 mmol/L (ref 18–29)
Chloride: 100 mmol/L (ref 96–106)
Creatinine, Ser: 1.11 mg/dL (ref 0.76–1.27)
GFR calc Af Amer: 93 mL/min/{1.73_m2} (ref >59)
GFR, EST NON AFRICAN AMERICAN: 80 mL/min/{1.73_m2} (ref >59)
GLOBULIN, TOTAL: 2.4 g/dL (ref 1.5–4.5)
Glucose: 120 mg/dL — ABNORMAL HIGH (ref 65–99)
POTASSIUM: 5 mmol/L (ref 3.5–5.2)
SODIUM: 142 mmol/L (ref 134–144)
Total Protein: 7.1 g/dL (ref 6.0–8.5)

## 2015-03-20 LAB — CBC WITH DIFFERENTIAL/PLATELET
BASOS: 1 %
Basophils Absolute: 0.1 10*3/uL (ref 0.0–0.2)
EOS (ABSOLUTE): 0.2 10*3/uL (ref 0.0–0.4)
EOS: 3 %
HEMATOCRIT: 46.1 % (ref 37.5–51.0)
HEMOGLOBIN: 15.9 g/dL (ref 12.6–17.7)
IMMATURE GRANS (ABS): 0 10*3/uL (ref 0.0–0.1)
IMMATURE GRANULOCYTES: 0 %
LYMPHS: 37 %
Lymphocytes Absolute: 2.4 10*3/uL (ref 0.7–3.1)
MCH: 31.9 pg (ref 26.6–33.0)
MCHC: 34.5 g/dL (ref 31.5–35.7)
MCV: 93 fL (ref 79–97)
Monocytes Absolute: 0.5 10*3/uL (ref 0.1–0.9)
Monocytes: 7 %
NEUTROS ABS: 3.4 10*3/uL (ref 1.4–7.0)
NEUTROS PCT: 52 %
Platelets: 244 10*3/uL (ref 150–379)
RBC: 4.98 x10E6/uL (ref 4.14–5.80)
RDW: 13.2 % (ref 12.3–15.4)
WBC: 6.5 10*3/uL (ref 3.4–10.8)

## 2015-03-20 LAB — TSH: TSH: 3.53 u[IU]/mL (ref 0.450–4.500)

## 2015-03-20 LAB — LIPID PANEL W/O CHOL/HDL RATIO
Cholesterol, Total: 255 mg/dL — ABNORMAL HIGH (ref 100–199)
HDL: 39 mg/dL — ABNORMAL LOW (ref >39)
LDL CALC: 183 mg/dL — AB (ref 0–99)
TRIGLYCERIDES: 163 mg/dL — AB (ref 0–149)
VLDL Cholesterol Cal: 33 mg/dL (ref 5–40)

## 2015-03-20 LAB — HIV ANTIBODY (ROUTINE TESTING W REFLEX): HIV SCREEN 4TH GENERATION: NONREACTIVE

## 2015-03-20 LAB — PSA: Prostate Specific Ag, Serum: 0.7 ng/mL (ref 0.0–4.0)

## 2015-03-20 NOTE — Telephone Encounter (Signed)
Phone call Discussed with patient elevated glucose and cholesterol Patient eats chocolate ice cream every night Discussed diet change recheck lipid panel and BMP in several months

## 2015-03-20 NOTE — Telephone Encounter (Signed)
-----   Message from Lurlean Horns, CMA sent at 03/20/2015 11:36 AM EST ----- labs

## 2015-04-01 ENCOUNTER — Encounter: Payer: Self-pay | Admitting: Family Medicine

## 2015-04-01 ENCOUNTER — Ambulatory Visit (INDEPENDENT_AMBULATORY_CARE_PROVIDER_SITE_OTHER): Payer: BLUE CROSS/BLUE SHIELD | Admitting: Family Medicine

## 2015-04-01 VITALS — BP 122/80 | HR 70 | Temp 99.5°F | Ht 65.7 in | Wt 144.0 lb

## 2015-04-01 DIAGNOSIS — J209 Acute bronchitis, unspecified: Secondary | ICD-10-CM | POA: Diagnosis not present

## 2015-04-01 DIAGNOSIS — R509 Fever, unspecified: Secondary | ICD-10-CM

## 2015-04-01 DIAGNOSIS — R05 Cough: Secondary | ICD-10-CM

## 2015-04-01 DIAGNOSIS — R059 Cough, unspecified: Secondary | ICD-10-CM

## 2015-04-01 LAB — INFLUENZA A AND B
INFLUENZA A AG, EIA: NEGATIVE
Influenza B Ag, EIA: NEGATIVE

## 2015-04-01 LAB — PLEASE NOTE:

## 2015-04-01 MED ORDER — PREDNISONE 10 MG PO TABS: ORAL_TABLET | ORAL | Status: DC

## 2015-04-01 MED ORDER — ALBUTEROL SULFATE (2.5 MG/3ML) 0.083% IN NEBU: 2.5000 mg | INHALATION_SOLUTION | Freq: Once | RESPIRATORY_TRACT | Status: DC

## 2015-04-01 MED ORDER — AZITHROMYCIN 250 MG PO TABS: ORAL_TABLET | ORAL | Status: DC

## 2015-04-01 MED ORDER — BENZONATATE 200 MG PO CAPS: 200.0000 mg | ORAL_CAPSULE | Freq: Two times a day (BID) | ORAL | Status: DC | PRN

## 2015-04-01 MED ORDER — HYDROCOD POLST-CPM POLST ER 10-8 MG/5ML PO SUER: 5.0000 mL | Freq: Every evening | ORAL | Status: DC | PRN

## 2015-04-01 NOTE — Progress Notes (Signed)
BP 122/80 mmHg  Pulse 70  Temp(Src) 99.5 F (37.5 C)  Ht 5' 5.7" (1.669 m)  Wt 144 lb (65.318 kg)  BMI 23.45 kg/m2  SpO2 97%   Subjective:    Patient ID: Daniel Lawson, male    DOB: 02/01/1970, 45 y.o.   MRN: [id number]  HPI: Daniel Lawson is a 45 y.o. male  Chief Complaint  Patient presents with  . URI    started Sunday afternoon with a cough.   UPPER RESPIRATORY TRACT INFECTION Duration: 2 days Worst symptom: decreased appetite Fever: yes up to 101.5 yesterday Cough: yes Shortness of breath: yes Wheezing: yes Chest pain: yes, with cough Chest tightness: yes Chest congestion: yes Nasal congestion: yes Runny nose: yes Post nasal drip: yes Sneezing: no Sore throat: yes Swollen glands: no Sinus pressure: no Headache: yes Face pain: no Toothache: no Ear pain: yes- going on for a while, following with ENT for chronic otitis  Ear pressure: yes  Eyes red/itching:no Eye drainage/crusting: no  Vomiting: no Rash: no Fatigue: yes Sick contacts: no Strep contacts: no  Context: stable Recurrent sinusitis: no Relief with OTC cold/cough medications: no  Treatments attempted: none   Relevant past medical, surgical, family and social history reviewed and updated as indicated. Interim medical history since our last visit reviewed. Allergies and medications reviewed and updated.  Review of Systems  Constitutional: Positive for fever and fatigue. Negative for chills, diaphoresis, activity change, appetite change and unexpected weight change.  HENT: Positive for congestion, ear pain, postnasal drip, rhinorrhea, sinus pressure, sneezing and sore throat. Negative for dental problem, drooling, ear discharge, facial swelling, hearing loss, mouth sores, nosebleeds, tinnitus, trouble swallowing and voice change.   Respiratory: Positive for cough, chest tightness, shortness of breath and wheezing. Negative for apnea, choking and stridor.   Cardiovascular: Positive for chest  pain. Negative for palpitations and leg swelling.  Psychiatric/Behavioral: Negative.     Per HPI unless specifically indicated above     Objective:    BP 122/80 mmHg  Pulse 70  Temp(Src) 99.5 F (37.5 C)  Ht 5' 5.7" (1.669 m)  Wt 144 lb (65.318 kg)  BMI 23.45 kg/m2  SpO2 97%  Wt Readings from Last 3 Encounters:  04/01/15 144 lb (65.318 kg)  03/19/15 154 lb (69.854 kg)  01/21/15 151 lb (68.493 kg)    Physical Exam  Constitutional: He is oriented to person, place, and time. He appears well-developed and well-nourished. No distress.  HENT:  Head: Normocephalic and atraumatic.  Right Ear: Hearing, tympanic membrane, external ear and ear canal normal.  Left Ear: Hearing, tympanic membrane, external ear and ear canal normal.  Nose: Mucosal edema and rhinorrhea present. No nose lacerations, sinus tenderness, nasal deformity, septal deviation or nasal septal hematoma. No epistaxis.  No foreign bodies. Right sinus exhibits no maxillary sinus tenderness and no frontal sinus tenderness. Left sinus exhibits no maxillary sinus tenderness and no frontal sinus tenderness.  Mouth/Throat: Uvula is midline, oropharynx is clear and moist and mucous membranes are normal. No oropharyngeal exudate.  Eyes: Conjunctivae, EOM and lids are normal. Pupils are equal, round, and reactive to light. Right eye exhibits no discharge. Left eye exhibits no discharge. No scleral icterus.  Neck: Normal range of motion. Neck supple. No JVD present. No tracheal deviation present. No thyromegaly present.  Cardiovascular: Normal rate, regular rhythm, normal heart sounds and intact distal pulses.  Exam reveals no gallop and no friction rub.   No murmur heard. Pulmonary/Chest: Effort normal. No stridor.  No respiratory distress. He has wheezes. He has no rales. He exhibits no tenderness.  Musculoskeletal: Normal range of motion.  Lymphadenopathy:    He has cervical adenopathy.  Neurological: He is alert and oriented to  person, place, and time.  Skin: Skin is warm, dry and intact. No rash noted. He is not diaphoretic. No erythema. No pallor.  Psychiatric: He has a normal mood and affect. His speech is normal and behavior is normal. Judgment and thought content normal. Cognition and memory are normal.  Nursing note and vitals reviewed.   Results for orders placed or performed in visit on 03/19/15  CBC with Differential/Platelet  Result Value Ref Range   WBC 6.5 3.4 - 10.8 x10E3/uL   RBC 4.98 4.14 - 5.80 x10E6/uL   Hemoglobin 15.9 12.6 - 17.7 g/dL   Hematocrit 16.1 09.6 - 51.0 %   MCV 93 79 - 97 fL   MCH 31.9 26.6 - 33.0 pg   MCHC 34.5 31.5 - 35.7 g/dL   RDW 04.5 40.9 - 81.1 %   Platelets 244 150 - 379 x10E3/uL   Neutrophils 52 %   Lymphs 37 %   Monocytes 7 %   Eos 3 %   Basos 1 %   Neutrophils Absolute 3.4 1.4 - 7.0 x10E3/uL   Lymphocytes Absolute 2.4 0.7 - 3.1 x10E3/uL   Monocytes Absolute 0.5 0.1 - 0.9 x10E3/uL   EOS (ABSOLUTE) 0.2 0.0 - 0.4 x10E3/uL   Basophils Absolute 0.1 0.0 - 0.2 x10E3/uL   Immature Granulocytes 0 %   Immature Grans (Abs) 0.0 0.0 - 0.1 x10E3/uL  Comprehensive metabolic panel  Result Value Ref Range   Glucose 120 (H) 65 - 99 mg/dL   BUN 14 6 - 24 mg/dL   Creatinine, Ser 9.14 0.76 - 1.27 mg/dL   GFR calc non Af Amer 80 >59 mL/min/1.73   GFR calc Af Amer 93 >59 mL/min/1.73   BUN/Creatinine Ratio 13 9 - 20   Sodium 142 134 - 144 mmol/L   Potassium 5.0 3.5 - 5.2 mmol/L   Chloride 100 96 - 106 mmol/L   CO2 25 18 - 29 mmol/L   Calcium 9.8 8.7 - 10.2 mg/dL   Total Protein 7.1 6.0 - 8.5 g/dL   Albumin 4.7 3.5 - 5.5 g/dL   Globulin, Total 2.4 1.5 - 4.5 g/dL   Albumin/Globulin Ratio 2.0 1.1 - 2.5   Bilirubin Total 0.4 0.0 - 1.2 mg/dL   Alkaline Phosphatase 55 39 - 117 IU/L   AST 15 0 - 40 IU/L   ALT 18 0 - 44 IU/L  Lipid Panel w/o Chol/HDL Ratio  Result Value Ref Range   Cholesterol, Total 255 (H) 100 - 199 mg/dL   Triglycerides 782 (H) 0 - 149 mg/dL   HDL 39 (L)  >95 mg/dL   VLDL Cholesterol Cal 33 5 - 40 mg/dL   LDL Calculated 621 (H) 0 - 99 mg/dL  PSA  Result Value Ref Range   Prostate Specific Ag, Serum 0.7 0.0 - 4.0 ng/mL  TSH  Result Value Ref Range   TSH 3.530 0.450 - 4.500 uIU/mL  Urinalysis, Routine w reflex microscopic (not at [facility])  Result Value Ref Range   Specific Gravity, UA 1.010 1.005 - 1.030   pH, UA 7.5 5.0 - 7.5   Color, UA Yellow Yellow   Appearance Ur Clear Clear   Leukocytes, UA Negative Negative   Protein, UA Negative Negative/Trace   Glucose, UA Negative Negative   Ketones, UA Negative  Negative   RBC, UA Negative Negative   Bilirubin, UA Negative Negative   Urobilinogen, Ur 0.2 0.2 - 1.0 mg/dL   Nitrite, UA Negative Negative  HIV antibody  Result Value Ref Range   HIV Screen 4th Generation wRfx Non Reactive Non Reactive      Assessment & Plan:   Problem List Items Addressed This Visit      Other   Cough   Relevant Orders   Influenza a and b    Other Visit Diagnoses    Acute bronchitis, unspecified organism    -  Primary    Better after neb. Will treat with zpack, prednisone and tessalon and tussionex. Call if not getting better or getting worse. Recheck lungs 2 weeks.     Relevant Medications    albuterol (PROVENTIL) (2.5 MG/3ML) 0.083% nebulizer solution 2.5 mg    Fever, unspecified fever cause        Will check for flu. Flu negative.     Relevant Orders    Influenza a and b        Follow up plan: Return in about 2 weeks (around 04/15/2015) for Lung recheck.

## 2015-04-14 ENCOUNTER — Encounter
Admission: RE | Admit: 2015-04-14 | Discharge: 2015-04-14 | Disposition: A | Discharge status: Home / Self Care | Payer: BLUE CROSS/BLUE SHIELD | Source: Ambulatory Visit | Attending: Otolaryngology | Admitting: Otolaryngology

## 2015-04-14 DIAGNOSIS — E785 Hyperlipidemia, unspecified: Secondary | ICD-10-CM | POA: Diagnosis not present

## 2015-04-14 DIAGNOSIS — Z8249 Family history of ischemic heart disease and other diseases of the circulatory system: Secondary | ICD-10-CM | POA: Diagnosis not present

## 2015-04-14 DIAGNOSIS — J45909 Unspecified asthma, uncomplicated: Secondary | ICD-10-CM | POA: Diagnosis not present

## 2015-04-14 DIAGNOSIS — E7800 Pure hypercholesterolemia, unspecified: Secondary | ICD-10-CM | POA: Diagnosis not present

## 2015-04-14 DIAGNOSIS — H7112 Cholesteatoma of tympanum, left ear: Secondary | ICD-10-CM | POA: Diagnosis present

## 2015-04-14 DIAGNOSIS — I10 Essential (primary) hypertension: Secondary | ICD-10-CM | POA: Diagnosis not present

## 2015-04-14 DIAGNOSIS — Z79899 Other long term (current) drug therapy: Secondary | ICD-10-CM | POA: Diagnosis not present

## 2015-04-14 HISTORY — DX: Pure hypercholesterolemia, unspecified: E78.00

## 2015-04-14 HISTORY — DX: Anxiety disorder, unspecified: F41.9

## 2015-04-14 HISTORY — DX: Unspecified dislocation of unspecified ulnohumeral joint, initial encounter: S53.106A

## 2015-04-14 HISTORY — DX: Reserved for inherently not codable concepts without codable children: IMO0001

## 2015-04-14 LAB — CBC
HCT: 44.2 % (ref 40.0–52.0)
Hemoglobin: 15.4 g/dL (ref 13.0–18.0)
MCH: 31.2 pg (ref 26.0–34.0)
MCHC: 34.9 g/dL (ref 32.0–36.0)
MCV: 89.5 fL (ref 80.0–100.0)
PLATELETS: 307 10*3/uL (ref 150–440)
RBC: 4.94 MIL/uL (ref 4.40–5.90)
RDW: 13.1 % (ref 11.5–14.5)
WBC: 9.5 10*3/uL (ref 3.8–10.6)

## 2015-04-14 LAB — DIFFERENTIAL
BASOS PCT: 1 %
Basophils Absolute: 0.1 10*3/uL (ref 0–0.1)
EOS PCT: 1 %
Eosinophils Absolute: 0.1 10*3/uL (ref 0–0.7)
LYMPHS PCT: 26 %
Lymphs Abs: 2.4 10*3/uL (ref 1.0–3.6)
Monocytes Absolute: 0.6 10*3/uL (ref 0.2–1.0)
Monocytes Relative: 7 %
NEUTROS PCT: 65 %
Neutro Abs: 6.3 10*3/uL (ref 1.4–6.5)

## 2015-04-14 NOTE — Pre-Procedure Instructions (Signed)
EKG called to Dr. Pernell DupreAdams and Dr. Pernell DupreAdams stated "OK".

## 2015-04-14 NOTE — Patient Instructions (Signed)
  Your procedure is scheduled on: April 16, 2015 (Wednesday) Report to Day Surgery.[facility](Medical Mall) Second Floor To find out your arrival time please call [phone] between 1PM - 3PM on April 15, 2015 (Tuesday).  Remember: Instructions that are not followed completely may result in serious medical risk, up to and including death, or upon the discretion of your surgeon and anesthesiologist your surgery may need to be rescheduled.    _x___ 1. Do not eat food or drink liquids after midnight. No gum chewing or hard candies.     _x___ 2. No Alcohol for 24 hours before or after surgery.   ____ 3. Bring all medications with you on the day of surgery if instructed.    _x___ 4. Notify your doctor if there is any change in your medical condition     (cold, fever, infections).     Do not wear jewelry, make-up, hairpins, clips or nail polish.  Do not wear lotions, powders, or perfumes. You may wear deodorant.  Do not shave 48 hours prior to surgery. Men may shave face and neck.  Do not bring valuables to the hospital.    [facility] belongings or valuables.               Contacts, dentures or bridgework may not be worn into surgery.  Leave your suitcase in the car. After surgery it may be brought to your room.  For patients admitted to the hospital, discharge time is determined by your                treatment team.   Patients discharged the day of surgery will not be allowed to drive home.   Please read over the following fact sheets that you were given:   Surgical Site Infection Prevention   ____ Take these medicines the morning of surgery with A SIP OF WATER:    1.   2.   3.   4.  5.  6.  ____ Fleet Enema (as directed)   ____ Use CHG Soap as directed  _x___ Use inhalers on the day of surgery (USE ALBUTEROL NEBULIZER AND ELLIPTA INHALER THE MORNING OF SURGERY , AND BRING INHALERS TO HOSPITAL)   ____ Stop metformin 2 days prior to surgery    ____ Take  1/2 of usual insulin dose the night before surgery and none on the morning of surgery.   __x__ Stop Coumadin/Plavix/aspirin on (N/A)  __x__ Stop Anti-inflammatories on (NO NSAIDS) (STOP MELOXICAM ONE WEEK PRIOR TO SURGERY)   ____ Stop supplements until after surgery.    ____ Bring C-Pap to the hospital.

## 2015-04-15 ENCOUNTER — Ambulatory Visit: Payer: BLUE CROSS/BLUE SHIELD | Admitting: Family Medicine

## 2015-04-16 ENCOUNTER — Encounter
Admission: RE | Disposition: A | Discharge status: Home / Self Care | Payer: Self-pay | Source: Ambulatory Visit | Attending: Otolaryngology

## 2015-04-16 ENCOUNTER — Ambulatory Visit
Admission: RE | Admit: 2015-04-16 | Discharge: 2015-04-16 | Disposition: A | Discharge status: Home / Self Care | Payer: BLUE CROSS/BLUE SHIELD | Source: Ambulatory Visit | Attending: Otolaryngology | Admitting: Otolaryngology

## 2015-04-16 ENCOUNTER — Ambulatory Visit: Payer: BLUE CROSS/BLUE SHIELD | Admitting: Anesthesiology

## 2015-04-16 ENCOUNTER — Encounter: Payer: Self-pay | Admitting: *Deleted

## 2015-04-16 DIAGNOSIS — H7112 Cholesteatoma of tympanum, left ear: Secondary | ICD-10-CM | POA: Diagnosis not present

## 2015-04-16 DIAGNOSIS — J45909 Unspecified asthma, uncomplicated: Secondary | ICD-10-CM | POA: Insufficient documentation

## 2015-04-16 DIAGNOSIS — E785 Hyperlipidemia, unspecified: Secondary | ICD-10-CM | POA: Insufficient documentation

## 2015-04-16 DIAGNOSIS — E7800 Pure hypercholesterolemia, unspecified: Secondary | ICD-10-CM | POA: Insufficient documentation

## 2015-04-16 DIAGNOSIS — Z79899 Other long term (current) drug therapy: Secondary | ICD-10-CM | POA: Insufficient documentation

## 2015-04-16 DIAGNOSIS — Z8249 Family history of ischemic heart disease and other diseases of the circulatory system: Secondary | ICD-10-CM | POA: Insufficient documentation

## 2015-04-16 HISTORY — PX: TYMPANOMASTOIDECTOMY: SHX34

## 2015-04-16 LAB — URINE DRUG SCREEN, QUALITATIVE (ARMC ONLY)
Amphetamines, Ur Screen: NOT DETECTED
BARBITURATES, UR SCREEN: NOT DETECTED
BENZODIAZEPINE, UR SCRN: NOT DETECTED
CANNABINOID 50 NG, UR ~~LOC~~: POSITIVE — AB
Cocaine Metabolite,Ur ~~LOC~~: NOT DETECTED
MDMA (Ecstasy)Ur Screen: NOT DETECTED
METHADONE SCREEN, URINE: NOT DETECTED
OPIATE, UR SCREEN: NOT DETECTED
PHENCYCLIDINE (PCP) UR S: NOT DETECTED
Tricyclic, Ur Screen: NOT DETECTED

## 2015-04-16 SURGERY — TYMPANOPLASTY, WITH MASTOIDECTOMY
Anesthesia: General | Site: Ear | Laterality: Left | Wound class: Clean

## 2015-04-16 MED ORDER — PROPOFOL 10 MG/ML IV BOLUS
INTRAVENOUS | Status: DC | PRN
  Administered 2015-04-16: 50 mg via INTRAVENOUS
  Administered 2015-04-16: 150 mg via INTRAVENOUS

## 2015-04-16 MED ORDER — BACITRACIN 500 UNIT/GM EX OINT
TOPICAL_OINTMENT | CUTANEOUS | Status: DC | PRN
  Administered 2015-04-16: 1 via TOPICAL

## 2015-04-16 MED ORDER — DEXAMETHASONE SODIUM PHOSPHATE 10 MG/ML IJ SOLN
INTRAMUSCULAR | Status: DC | PRN
  Administered 2015-04-16: 10 mg via INTRAVENOUS

## 2015-04-16 MED ORDER — GELATIN ABSORBABLE 12-7 MM EX MISC
CUTANEOUS | Status: AC
  Filled 2015-04-16: qty 1

## 2015-04-16 MED ORDER — OFLOXACIN 0.3 % OP SOLN
OPHTHALMIC | Status: AC
  Filled 2015-04-16: qty 5

## 2015-04-16 MED ORDER — GELATIN ABSORBABLE 12-7 MM EX MISC
CUTANEOUS | Status: DC | PRN
  Administered 2015-04-16: .5

## 2015-04-16 MED ORDER — HYDROCODONE-ACETAMINOPHEN 5-325 MG PO TABS: 2.0000 | ORAL_TABLET | Freq: Once | ORAL | Status: DC

## 2015-04-16 MED ORDER — FAMOTIDINE 20 MG PO TABS
ORAL_TABLET | ORAL | Status: AC
  Administered 2015-04-16: 20 mg via ORAL
  Filled 2015-04-16: qty 1

## 2015-04-16 MED ORDER — BACITRACIN ZINC 500 UNIT/GM EX OINT
TOPICAL_OINTMENT | CUTANEOUS | Status: AC
  Filled 2015-04-16: qty 28.35

## 2015-04-16 MED ORDER — FENTANYL CITRATE (PF) 100 MCG/2ML IJ SOLN: 25.0000 ug | INTRAMUSCULAR | Status: DC | PRN

## 2015-04-16 MED ORDER — ONDANSETRON HCL 4 MG/2ML IJ SOLN
INTRAMUSCULAR | Status: DC | PRN
  Administered 2015-04-16: 4 mg via INTRAVENOUS

## 2015-04-16 MED ORDER — OFLOXACIN 0.3 % OP SOLN: 4.0000 [drp] | Freq: Two times a day (BID) | OPHTHALMIC | Status: DC

## 2015-04-16 MED ORDER — FENTANYL CITRATE (PF) 100 MCG/2ML IJ SOLN
INTRAMUSCULAR | Status: DC | PRN
  Administered 2015-04-16 (×2): 50 ug via INTRAVENOUS
  Administered 2015-04-16: 100 ug via INTRAVENOUS

## 2015-04-16 MED ORDER — LACTATED RINGERS IV SOLN
INTRAVENOUS | Status: DC
  Administered 2015-04-16: 08:00:00 via INTRAVENOUS

## 2015-04-16 MED ORDER — HYDROCODONE-ACETAMINOPHEN 5-325 MG PO TABS: ORAL_TABLET | ORAL | Status: DC

## 2015-04-16 MED ORDER — REMIFENTANIL HCL 1 MG IV SOLR
INTRAVENOUS | Status: DC | PRN
  Administered 2015-04-16: 11:00:00 via INTRAVENOUS
  Administered 2015-04-16: .2 ug/kg/min via INTRAVENOUS

## 2015-04-16 MED ORDER — FAMOTIDINE 20 MG PO TABS
20.0000 mg | ORAL_TABLET | Freq: Once | ORAL | Status: AC
  Administered 2015-04-16: 20 mg via ORAL

## 2015-04-16 MED ORDER — LIDOCAINE-EPINEPHRINE (PF) 1 %-1:200000 IJ SOLN
INTRAMUSCULAR | Status: DC | PRN
  Administered 2015-04-16: 4 mL

## 2015-04-16 MED ORDER — GELATIN ABSORBABLE 100 CM EX MISC
CUTANEOUS | Status: AC
  Filled 2015-04-16: qty 1

## 2015-04-16 MED ORDER — LIDOCAINE-EPINEPHRINE (PF) 1 %-1:200000 IJ SOLN
INTRAMUSCULAR | Status: AC
  Filled 2015-04-16: qty 30

## 2015-04-16 MED ORDER — HYDROCODONE-ACETAMINOPHEN 5-325 MG PO TABS
ORAL_TABLET | ORAL | Status: AC
  Filled 2015-04-16: qty 2

## 2015-04-16 MED ORDER — LIDOCAINE HCL (CARDIAC) 20 MG/ML IV SOLN
INTRAVENOUS | Status: DC | PRN
  Administered 2015-04-16: 30 mg via INTRAVENOUS

## 2015-04-16 MED ORDER — HYDROCODONE-ACETAMINOPHEN 5-325 MG PO TABS: 1.0000 | ORAL_TABLET | Freq: Four times a day (QID) | ORAL | Status: DC | PRN

## 2015-04-16 MED ORDER — EPINEPHRINE HCL 1 MG/ML IJ SOLN
INTRAMUSCULAR | Status: AC
  Filled 2015-04-16: qty 1

## 2015-04-16 MED ORDER — ROCURONIUM BROMIDE 100 MG/10ML IV SOLN
INTRAVENOUS | Status: DC | PRN
  Administered 2015-04-16: 50 mg via INTRAVENOUS

## 2015-04-16 MED ORDER — MIDAZOLAM HCL 2 MG/2ML IJ SOLN
INTRAMUSCULAR | Status: DC | PRN
  Administered 2015-04-16: 2 mg via INTRAVENOUS

## 2015-04-16 MED ORDER — PROMETHAZINE HCL 25 MG/ML IJ SOLN: 6.2500 mg | INTRAMUSCULAR | Status: DC | PRN

## 2015-04-16 MED ORDER — OFLOXACIN 0.3 % OT SOLN
OTIC | Status: DC | PRN
  Administered 2015-04-16: 10 [drp] via OTIC

## 2015-04-16 SURGICAL SUPPLY — 49 items
ADHESIVE MASTISOL STRL (MISCELLANEOUS) ×6 IMPLANT
BLADE EAR TYMPAN 2.5 60D BEAV (BLADE) ×3 IMPLANT
BLADE EAR TYMPAN 2.5 STR BEAV (BLADE) IMPLANT
BLADE SURG 15 STRL LF DISP TIS (BLADE) ×1 IMPLANT
BLADE SURG 15 STRL SS (BLADE) ×2
BUR RND TPR 3.0 ELITE (BURR) ×3 IMPLANT
BUR ROUND FLUTED 5 RND (BURR) IMPLANT
BUR ROUND FLUTED 5MM RND (BURR)
BUR SABER DIAMOND 3.0 (BURR) ×3 IMPLANT
BUR SABER RD CUTTING 3.0 (BURR) ×2 IMPLANT
BUR SABER RD CUTTING 3.0MM (BURR) ×1
BUR SABER TAPERED DIAMOND 1 (BURR) IMPLANT
CANISTER SUCT 1200ML W/VALVE (MISCELLANEOUS) ×3 IMPLANT
CATH IV ANGIO 16GX3.25 GREY (CATHETERS) ×1 IMPLANT
COTTON BALL STRL MEDIUM (GAUZE/BANDAGES/DRESSINGS) ×3 IMPLANT
COVER LIGHT HANDLE STERIS (MISCELLANEOUS) ×3 IMPLANT
CUP MEDICINE 2OZ PLAST GRAD ST (MISCELLANEOUS) ×3 IMPLANT
DECANTER SPIKE VIAL GLASS SM (MISCELLANEOUS) ×3 IMPLANT
DRAIN PENROSE 1/4X12 LTX (DRAIN) ×3 IMPLANT
DRAPE MICROSCOPE ZEISS 48X118 (DRAPES) ×3 IMPLANT
DRAPE SURG 17X11 SM STRL (DRAPES) ×6 IMPLANT
DRAPE SURG IRRIG POUCH 19X23 (DRAPES) ×3 IMPLANT
DRESSING TELFA 4X3 1S ST N-ADH (GAUZE/BANDAGES/DRESSINGS) ×3 IMPLANT
DRSG GLASSCOCK MASTOID ADT (GAUZE/BANDAGES/DRESSINGS) ×3 IMPLANT
EAR DORNHOFFER PT OSSIC 3.2X2 (Miscellaneous) ×3 IMPLANT
ELECT EMG 20MM DUAL (MISCELLANEOUS) ×6
ELECT REM PT RETURN 9FT ADLT (ELECTROSURGICAL) ×3
ELECTRODE EMG 20MM DUAL (MISCELLANEOUS) ×2 IMPLANT
ELECTRODE REM PT RTRN 9FT ADLT (ELECTROSURGICAL) ×1 IMPLANT
GLOVE BIO SURGEON STRL SZ7.5 (GLOVE) ×6 IMPLANT
GOWN STRL REUS W/ TWL LRG LVL3 (GOWN DISPOSABLE) ×2 IMPLANT
GOWN STRL REUS W/TWL LRG LVL3 (GOWN DISPOSABLE) ×4
IMPL EAR DRNHFFR OSSIC3.2X2 (Miscellaneous) ×1 IMPLANT
IV CATH ANGIO 16GX3.25 GREY (CATHETERS) ×3
IV NS 500ML (IV SOLUTION) ×2
IV NS 500ML BAXH (IV SOLUTION) ×1 IMPLANT
NEEDLE FILTER BLUNT 18X 1/2SAF (NEEDLE) ×4
NEEDLE FILTER BLUNT 18X1 1/2 (NEEDLE) ×2 IMPLANT
NS IRRIG 500ML POUR BTL (IV SOLUTION) ×3 IMPLANT
PACK HEAD/NECK (MISCELLANEOUS) ×3 IMPLANT
SLEEVE PROTECTION STRL DISP (MISCELLANEOUS) ×6 IMPLANT
SUT PLAIN GUT FAST 5-0 (SUTURE) ×3 IMPLANT
SUT VIC AB 4-0 RB1 27 (SUTURE) ×2
SUT VIC AB 4-0 RB1 27X BRD (SUTURE) ×1 IMPLANT
SYR 20CC LL (SYRINGE) ×3 IMPLANT
SYR 3ML LL SCALE MARK (SYRINGE) ×6 IMPLANT
SYRINGE 10CC LL (SYRINGE) ×3 IMPLANT
TUBING IRRIGATION (MISCELLANEOUS) ×3 IMPLANT
WIPE NON LINTING 3.25X3.25 (MISCELLANEOUS) ×3 IMPLANT

## 2015-04-16 NOTE — Discharge Instructions (Signed)

## 2015-04-16 NOTE — Transfer of Care (Signed)
Immediate Anesthesia Transfer of Care Note  Patient: Daniel Lawson  Procedure(s) Performed: Procedure(s): TYMPANOMASTOIDECTOMY WITH OSSICULAR CHAIN RECONSTRUCTION (Left)  Patient Location: PACU  Anesthesia Type:General  Level of Consciousness: awake, alert  and oriented  Airway & Oxygen Therapy: Patient Spontanous Breathing and Patient connected to face mask  Post-op Assessment: Report given to RN and Post -op Vital signs reviewed and stable  Post vital signs: Reviewed and stable  Last Vitals:  Filed Vitals:   04/16/15 0814 04/16/15 1334  BP: 126/84 147/76  Pulse: 56 84  Temp: 35.9 C 36.9 C  Resp: 16 14    Complications: No apparent anesthesia complications
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
## 2015-04-16 NOTE — Op Note (Signed)
04/16/2015  1:18 PM    Daniel Lawson  [id number]   Pre-Op Diagnosis:  LEFT CHOLESTOMA Post-op Diagnosis: LEFT CHOLESTOMA  Procedure: Left canal wall down tympanomastoidectomy with ossicular chain reconstruction Surgeon:  Sandi Mealy  Anesthesia:  General endotracheal anesthesia  EBL:  <25 cc  Complications:  None  Findings: There was extensive recurrence of cholesteatoma in the epitympanum, abutting the previously placed prosthesis and stapes, and extending back into the mastoid through the mastoid antrum. There was some focal erosion of the bone over the posterior half of the lateral semicircular canal without any evidence of perilymph fistula.  Procedure: The patient was taken to the Operating Room and placed in the supine position.  After induction of general endotracheal anesthesia, the patient was turned 90 degrees. After a time-out confirming the consented procedure and site, 1% lidocaine with epinephrine 1:200,000 was injected into the postauricular region and, under visualization with the operating microscope, into the canal skin in the posterior and inferior canal. The left ear was then prepped and draped in the usual sterile fashion. The ear was evaluated under the operating microscope through an ear speculum, and the canal cleaned and irrigated with saline. Canal incisions were made at 12 and 6:00 vertically followed by a horizontal canal incision 4 mm behind the annulus. An incision was made with a 15 blade just behind the post-auricular crease below the hairline. The incision was carried down through the subcutaneous tissues with the Bovie, and dissection carried down to the mastoid, taking care to not enter the previous mastoid defect. The bony edges of the mastoid cavity were identified and the temporalis muscle dissected out superiorly. Scissors were used to harvest an adequately sized fascia graft, which was set aside, pressed and dried for later use. A Lempert elevator  was then used along with a Therapist, nutritional to dissect anterior to the mastoid cavity and down into the canal to the level of the previously made canal cuts. There was extensive scar tissue in this area and inflammatory tissue noted in the mastoid. This inflammatory tissue was dissected out elevating it down towards the antrum.  The ear was then retracted forward with a Penrose drain and self-retaining retractor. The canal skin flap was elevated down to the annulus and dissection proceeded over the visible prosthesis, lifting the tympanic membrane from the prosthesis and inspecting the middle ear. The middle ear was entered inferior to the prosthesis. No fluid or infection was seen. There was a soft tissue mass around the superior aspect of the prosthesis. The old prosthesis was carefully removed taking care not to injure the stapes. The adhesions of the soft tissue mass to the remaining tympanic membrane were divided with Bellucci scissors. Inspecting the mastoid and the middle ear was clear this was a large cholesteatoma extending back into the mastoid. At this point it was felt best to proceed with a canal wall down mastoid procedure. The canal wall was taken down with a #3 cutting bur initially. A #5 cutting bur was used to smooth the edges of the mastoid cavity. The #3 cutting bur was used to dissect down to the level of the tegmen. Further dissection of the canal wall proceeded with a #5 cutting bur close to the level of the facial nerve followed by further dissection using the #3 diamond bur. With the cholesteatoma exposed, it was carefully dissected out, dissecting it from the mastoid antrum and the epitympanum. The lateral semicircular canal was identified in the middle ear after dissecting  the cholesteatoma away from the stapes. As the cholesteatoma was dissected backwards, there was noted to be some apparent erosion of the posterior half of the lateral semicircular canal, but no evidence of any perilymph  leak. There still appeared to be some soft tissue deep to the cholesteatoma, likely middle ear mucosa that was inflamed, over the semicircular canal and this was not disturbed. The cholesteatoma was completely removed. The middle ear was irrigated to remove any residual debris. Some scar tissue around the inferior aspect of the stapes was dissected away carefully. The stapes was noted to be still intact and mobile. A #[id number] Dornhoffer PORP was used to reconstruct the Ossicular chain, placing it over the stapes, and under the remnant of the malleus. The head of the malleus had been removed and the previous procedure. The temporalis fascia graft was then placed over the prosthesis into the middle ear and laid across the facial ridge, laying some of the fascial graft over the exposed portion of the lateral semicircular canal. The tympanomeatal flap was then placed back into anatomic position, and good placement of the graft confirmed, and the malleus remnant over the groove in the prosthesis head. This appeared to be in good position so the graft and tympanomeatal flap were partially elevated to allow packing of the middle ear with Gelfoam, moistened with Floxin, around the prosthesis and to support the graft. Next the tympanomeatal flap was placed back in good position and Floxin moistened Gelfoam placed lateral to this. A meatal plasty was then performed, excising some of the cartilage and soft tissue of the medial concha, and extending the canal incisions more laterally to relax the canal skin. The postauricular incision was then closed with 4-0 Vicryl suture followed by 5-0 fast absorbing gut suture for the skin. The posterior canal flap was everted and placed into anatomic position and the mastoid further packed with Floxin moistened Gelfoam. A Glasscock dressing was then placed. The patient was then returned to the anesthesiologist for awakening, and was taken to the Recovery Room in stable  condition.  Disposition:   PACU then discharge home  Plan: Remove the ear dressing as instructed, then start Floxin ear drops as prescribed and water precautions.  Antibiotic ointment to the postauricular wound. Follow-up at my office as scheduled.  Sandi MealyBennett, Daniel Wisdom S 04/16/2015 1:18 PM

## 2015-04-16 NOTE — H&P (Signed)
History and physical reviewed and will be scanned in later. No change in medical status reported by the patient or family, appears stable for surgery. All questions regarding the procedure answered, and patient (or family if a child) expressed understanding of the procedure.  Kjell Brannen S @TODAY@ 

## 2015-04-16 NOTE — Anesthesia Procedure Notes (Signed)
Procedure Name: Intubation Date/Time: 04/16/2015 9:18 AM Performed by: Omer JackWEATHERLY, Daniel Stebner Pre-anesthesia Checklist: Patient identified, Patient being monitored, Timeout performed, Emergency Drugs available and Suction available Patient Re-evaluated:Patient Re-evaluated prior to inductionOxygen Delivery Method: Circle system utilized Preoxygenation: Pre-oxygenation with 100% oxygen Intubation Type: IV induction Ventilation: Mask ventilation without difficulty Laryngoscope Size: Miller and 2 Grade View: Grade I Tube type: Oral Rae Tube size: 7.5 mm Number of attempts: 1 Placement Confirmation: ETT inserted through vocal cords under direct vision,  positive ETCO2 and breath sounds checked- equal and bilateral Secured at: 21 cm Tube secured with: Tape Dental Injury: Teeth and Oropharynx as per pre-operative assessment

## 2015-04-17 NOTE — Anesthesia Postprocedure Evaluation (Signed)
Anesthesia Post Note  Patient: Daniel Lawson  Procedure(s) Performed: Procedure(s) (LRB): TYMPANOMASTOIDECTOMY WITH OSSICULAR CHAIN RECONSTRUCTION (Left)  Patient location during evaluation: PACU Anesthesia Type: General Level of consciousness: awake and alert Pain management: pain level controlled Vital Signs Assessment: post-procedure vital signs reviewed and stable Respiratory status: spontaneous breathing, nonlabored ventilation, respiratory function stable and patient connected to nasal cannula oxygen Cardiovascular status: blood pressure returned to baseline and stable Postop Assessment: no signs of nausea or vomiting Anesthetic complications: no    Last Vitals:  Filed Vitals:   04/16/15 1413 04/16/15 1441  BP: 157/71 145/73  Pulse: 71 64  Temp: 36.6 C   Resp: 18 18    Last Pain:  Filed Vitals:   04/16/15 1444  PainSc: 3                  Lenard SimmerAndrew Danyetta Gillham

## 2015-04-18 LAB — SURGICAL PATHOLOGY

## 2015-08-12 ENCOUNTER — Other Ambulatory Visit: Payer: Self-pay

## 2015-08-12 MED ORDER — FLUOXETINE HCL 20 MG PO TABS: 20.0000 mg | ORAL_TABLET | Freq: Every day | ORAL | Status: DC

## 2015-08-12 NOTE — Telephone Encounter (Signed)
Pharmacy: [facility] Daniel Lawson Medication: Fluoxetine 20mg  Last Visit: 04/01/2015   Requesting a 90-day supply for Fluoxetine instead of a 30 day supply.

## 2015-09-17 ENCOUNTER — Ambulatory Visit (INDEPENDENT_AMBULATORY_CARE_PROVIDER_SITE_OTHER): Payer: BLUE CROSS/BLUE SHIELD | Admitting: Family Medicine

## 2015-09-17 ENCOUNTER — Encounter: Payer: Self-pay | Admitting: Family Medicine

## 2015-09-17 VITALS — BP 123/72 | HR 58 | Temp 98.1°F | Ht 66.7 in | Wt 149.0 lb

## 2015-09-17 DIAGNOSIS — F329 Major depressive disorder, single episode, unspecified: Secondary | ICD-10-CM

## 2015-09-17 DIAGNOSIS — M545 Low back pain, unspecified: Secondary | ICD-10-CM

## 2015-09-17 DIAGNOSIS — E7800 Pure hypercholesterolemia, unspecified: Secondary | ICD-10-CM

## 2015-09-17 DIAGNOSIS — F32A Depression, unspecified: Secondary | ICD-10-CM

## 2015-09-17 DIAGNOSIS — R7309 Other abnormal glucose: Secondary | ICD-10-CM | POA: Diagnosis not present

## 2015-09-17 LAB — LIPID PANEL PICCOLO, WAIVED
CHOL/HDL RATIO PICCOLO,WAIVE: 5.2 mg/dL — AB
CHOLESTEROL PICCOLO, WAIVED: 199 mg/dL (ref <200)
HDL Chol Piccolo, Waived: 38 mg/dL — ABNORMAL LOW (ref >59)
LDL Chol Calc Piccolo Waived: 123 mg/dL — ABNORMAL HIGH (ref <100)
TRIGLYCERIDES PICCOLO,WAIVED: 191 mg/dL — AB (ref <150)
VLDL Chol Calc Piccolo,Waive: 38 mg/dL — ABNORMAL HIGH (ref <30)

## 2015-09-17 NOTE — Progress Notes (Signed)
BP 123/72 (BP Location: Left Arm, Patient Position: Sitting, Cuff Size: Small)   Pulse (!) 58   Temp 98.1 F (36.7 C)   Ht 5' 6.7" (1.694 m)   Wt 149 lb (67.6 kg)   SpO2 94%   BMI 23.55 kg/m    Subjective:    Patient ID: Daniel Lawson, male    DOB: 04/10/1970, 45 y.o.   MRN: [id number]  HPI: Daniel Lawson is a 45 y.o. male  Chief Complaint  Patient presents with  . medication check   Depression anxiety doing well no complaints from fluoxetine Patient is having various aches and pains bilateral arms and shoulder with some numbness when he lays down especially on his side also has some with neck flexion and looking at his phone or electronic devices. 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 Patient does do things like move up yellow yesterday and other heavy lifting and moving Also has right lateral epicondylitis changes after hitting the elbow with a softball Relevant past medical, surgical, family and social history reviewed and updated as indicated. Interim medical history since our last visit reviewed. Allergies and medications reviewed and updated.  Review of Systems  Constitutional: Negative.   Respiratory: Negative.   Cardiovascular: Negative.     Per HPI unless specifically indicated above     Objective:    BP 123/72 (BP Location: Left Arm, Patient Position: Sitting, Cuff Size: Small)   Pulse (!) 58   Temp 98.1 F (36.7 C)   Ht 5' 6.7" (1.694 m)   Wt 149 lb (67.6 kg)   SpO2 94%   BMI 23.55 kg/m   Wt Readings from Last 3 Encounters:  09/17/15 149 lb (67.6 kg)  04/16/15 150 lb (68 kg)  04/14/15 150 lb (68 kg)    Physical Exam  Constitutional: He is oriented to person, place, and time. He appears well-developed and well-nourished. No distress.  HENT:  Head: Normocephalic and atraumatic.  Right Ear: Hearing normal.  Left Ear: Hearing normal.  Nose: Nose normal.  Eyes: Conjunctivae and lids are normal. Right eye  exhibits no discharge. Left eye exhibits no discharge. No scleral icterus.  Cardiovascular: Normal rate, regular rhythm and normal heart sounds.   Pulmonary/Chest: Effort normal and breath sounds normal. No respiratory distress.  Musculoskeletal: Normal range of motion.  Neck or shoulders normal pain with resistance to extension right lateral epicondyles Back clear normal reflexes and straight leg raising all normal no neurological deficits  Neurological: He is alert and oriented to person, place, and time.  Skin: Skin is intact. No rash noted.  Psychiatric: He has a normal mood and affect. His speech is normal and behavior is normal. Judgment and thought content normal. Cognition and memory are normal.        Assessment & Plan:   Problem List Items Addressed This Visit      Other   Low back pain    Patient with multiple orthopedic inflammatory conditions discuss lifestyle modification proper posture and lifting technique and moderation will use meloxicam      Depression    The current medical regimen is effective;  continue present plan and medications.       Hypercholesterolemia    Patient with markedly elevated cholesterol last visit is been doing better with diet nutrition now cholesterol is back to normal       Other Visit Diagnoses    Elevated glucose    -  Primary  Relevant Orders   Basic metabolic panel   Elevated cholesterol       Relevant Orders   Lipid Panel Piccolo, Waived       Follow up plan: Return in about 6 months (around 03/19/2016) for Physical Exam.

## 2015-09-17 NOTE — Assessment & Plan Note (Signed)
The current medical regimen is effective;  continue present plan and medications.  

## 2015-09-17 NOTE — Assessment & Plan Note (Signed)
Patient with multiple orthopedic inflammatory conditions discuss lifestyle modification proper posture and lifting technique and moderation will use meloxicam

## 2015-09-17 NOTE — Assessment & Plan Note (Signed)
Patient with markedly elevated cholesterol last visit is been doing better with diet nutrition now cholesterol is back to normal

## 2015-09-18 ENCOUNTER — Encounter: Payer: Self-pay | Admitting: Family Medicine

## 2015-09-18 LAB — BASIC METABOLIC PANEL WITH GFR
BUN/Creatinine Ratio: 23 — ABNORMAL HIGH (ref 9–20)
BUN: 20 mg/dL (ref 6–24)
CO2: 25 mmol/L (ref 18–29)
Calcium: 9.3 mg/dL (ref 8.7–10.2)
Chloride: 101 mmol/L (ref 96–106)
Creatinine, Ser: 0.88 mg/dL (ref 0.76–1.27)
GFR calc Af Amer: 120 mL/min/1.73
GFR calc non Af Amer: 104 mL/min/1.73
Glucose: 84 mg/dL (ref 65–99)
Potassium: 4.3 mmol/L (ref 3.5–5.2)
Sodium: 140 mmol/L (ref 134–144)

## 2016-03-22 ENCOUNTER — Ambulatory Visit (INDEPENDENT_AMBULATORY_CARE_PROVIDER_SITE_OTHER): Payer: Managed Care, Other (non HMO) | Admitting: Family Medicine

## 2016-03-22 ENCOUNTER — Encounter: Payer: Self-pay | Admitting: Family Medicine

## 2016-03-22 VITALS — BP 128/76 | HR 68 | Ht 67.32 in | Wt 151.0 lb

## 2016-03-22 DIAGNOSIS — Z125 Encounter for screening for malignant neoplasm of prostate: Secondary | ICD-10-CM

## 2016-03-22 DIAGNOSIS — M7710 Lateral epicondylitis, unspecified elbow: Secondary | ICD-10-CM | POA: Insufficient documentation

## 2016-03-22 DIAGNOSIS — M7711 Lateral epicondylitis, right elbow: Secondary | ICD-10-CM

## 2016-03-22 DIAGNOSIS — Z0000 Encounter for general adult medical examination without abnormal findings: Secondary | ICD-10-CM | POA: Diagnosis not present

## 2016-03-22 DIAGNOSIS — F3342 Major depressive disorder, recurrent, in full remission: Secondary | ICD-10-CM

## 2016-03-22 DIAGNOSIS — E7800 Pure hypercholesterolemia, unspecified: Secondary | ICD-10-CM | POA: Diagnosis not present

## 2016-03-22 DIAGNOSIS — Z23 Encounter for immunization: Secondary | ICD-10-CM

## 2016-03-22 DIAGNOSIS — Z1329 Encounter for screening for other suspected endocrine disorder: Secondary | ICD-10-CM | POA: Diagnosis not present

## 2016-03-22 LAB — URINALYSIS, ROUTINE W REFLEX MICROSCOPIC
Bilirubin, UA: NEGATIVE
GLUCOSE, UA: NEGATIVE
KETONES UA: NEGATIVE
LEUKOCYTES UA: NEGATIVE
Nitrite, UA: NEGATIVE
Protein, UA: NEGATIVE
RBC UA: NEGATIVE
UUROB: 0.2 mg/dL (ref 0.2–1.0)
pH, UA: 6 (ref 5.0–7.5)

## 2016-03-22 MED ORDER — MELOXICAM 15 MG PO TABS: 15.0000 mg | ORAL_TABLET | Freq: Every day | ORAL | 3 refills | Status: DC

## 2016-03-22 MED ORDER — FLUOXETINE HCL 20 MG PO TABS: 20.0000 mg | ORAL_TABLET | Freq: Every day | ORAL | 4 refills | Status: DC

## 2016-03-22 NOTE — Assessment & Plan Note (Signed)
Discussed tennis elbow care and treatment primarily limitations of use. We'll also use meloxicam. If not better will consider physical therapy.

## 2016-03-22 NOTE — Progress Notes (Signed)
BP 128/76   Pulse 68   Ht 5' 7.32" (1.71 m)   Wt 151 lb (68.5 kg)   SpO2 98%   BMI 23.42 kg/m    Subjective:    Patient ID: Daniel Lawson, male    DOB: 1970-10-04, 46 y.o.   MRN: [id number]  HPI: Daniel Lawson is a 46 y.o. male  Chief Complaint  Patient presents with  . Annual Exam  . Right arm    Right.    Patient with several concerns doing well with fluoxetine which is controlling his nerves adequately. Patient's biggest concern is right lateral epicondyles tenderness soreness redness really started in November when he was hit with a softball and now is unable to lift using extension. Hasn't tried other medications. Has had tennis elbow before in this elbow with a shot which helped a little bit but wants to avoid that if he can.  Relevant past medical, surgical, family and social history reviewed and updated as indicated. Interim medical history since our last visit reviewed. Allergies and medications reviewed and updated.  Review of Systems  Constitutional: Negative.   HENT: Negative.   Eyes: Negative.   Respiratory: Negative.   Cardiovascular: Negative.   Gastrointestinal: Negative.   Endocrine: Negative.   Genitourinary: Negative.   Musculoskeletal: Negative.   Skin: Negative.   Allergic/Immunologic: Negative.   Neurological: Negative.   Hematological: Negative.   Psychiatric/Behavioral: Negative.     Per HPI unless specifically indicated above     Objective:    BP 128/76   Pulse 68   Ht 5' 7.32" (1.71 m)   Wt 151 lb (68.5 kg)   SpO2 98%   BMI 23.42 kg/m   Wt Readings from Last 3 Encounters:  03/22/16 151 lb (68.5 kg)  09/17/15 149 lb (67.6 kg)  04/16/15 150 lb (68 kg)    Physical Exam  Constitutional: He is oriented to person, place, and time. He appears well-developed and well-nourished.  HENT:  Head: Normocephalic and atraumatic.  Right Ear: External ear normal.  Left Ear: External ear normal.  Eyes: Conjunctivae and EOM are normal.  Pupils are equal, round, and reactive to light.  Neck: Normal range of motion. Neck supple.  Cardiovascular: Normal rate, regular rhythm, normal heart sounds and intact distal pulses.   Pulmonary/Chest: Effort normal and breath sounds normal.  Abdominal: Soft. Bowel sounds are normal. There is no splenomegaly or hepatomegaly.  Genitourinary: Rectum normal, prostate normal and penis normal.  Musculoskeletal: Normal range of motion.  Neurological: He is alert and oriented to person, place, and time. He has normal reflexes.  Skin: No rash noted. No erythema.  Psychiatric: He has a normal mood and affect. His behavior is normal. Judgment and thought content normal.    Results for orders placed or performed in visit on 09/17/15  Lipid Panel Piccolo, [employer]  Result Value Ref Range   Cholesterol Piccolo, Waived 199 <200 mg/dL   HDL Chol Piccolo, Waived 38 (L) >59 mg/dL   Triglycerides Piccolo,Waived 191 (H) <150 mg/dL   Chol/HDL Ratio Piccolo,Waive 5.2 (H) mg/dL   LDL Chol Calc Piccolo Waived 123 (H) <100 mg/dL   VLDL Chol Calc Piccolo,Waive 38 (H) <30 mg/dL  Basic metabolic panel  Result Value Ref Range   Glucose 84 65 - 99 mg/dL   BUN 20 6 - 24 mg/dL   Creatinine, Ser 4.09 0.76 - 1.27 mg/dL   GFR calc non Af Amer 104 >59 mL/min/1.73   GFR calc Af Amer 120 >  59 mL/min/1.73   BUN/Creatinine Ratio 23 (H) 9 - 20   Sodium 140 134 - 144 mmol/L   Potassium 4.3 3.5 - 5.2 mmol/L   Chloride 101 96 - 106 mmol/L   CO2 25 18 - 29 mmol/L   Calcium 9.3 8.7 - 10.2 mg/dL      Assessment & Plan:   Problem List Items Addressed This Visit      Musculoskeletal and Integument   Epicondylitis, lateral (tennis elbow)    Discussed tennis elbow care and treatment primarily limitations of use. We'll also use meloxicam. If not better will consider physical therapy.      Relevant Medications   meloxicam (MOBIC) 15 MG tablet     Other   Depression    The current medical regimen is effective;  continue  present plan and medications.       Relevant Medications   FLUoxetine (PROZAC) 20 MG tablet   Hypercholesterolemia   Relevant Orders   CBC with Differential/Platelet   Comprehensive metabolic panel   Lipid panel   Urinalysis, Routine w reflex microscopic    Other Visit Diagnoses    Annual physical exam    -  Primary   Relevant Orders   CBC with Differential/Platelet   Comprehensive metabolic panel   Lipid panel   PSA   TSH   Urinalysis, Routine w reflex microscopic   Prostate cancer screening       Relevant Orders   PSA   Thyroid disorder screen       Relevant Orders   TSH   Need for Tdap vaccination       Relevant Orders   Tdap vaccine greater than or equal to 7yo IM (Completed)       Follow up plan: Return in about 6 months (around 09/19/2016) for recheck nerves.

## 2016-03-22 NOTE — Assessment & Plan Note (Signed)
The current medical regimen is effective;  continue present plan and medications.  

## 2016-03-23 ENCOUNTER — Encounter: Payer: Self-pay | Admitting: Family Medicine

## 2016-03-23 LAB — CBC WITH DIFFERENTIAL/PLATELET
BASOS ABS: 0 10*3/uL (ref 0.0–0.2)
Basos: 0 %
EOS (ABSOLUTE): 0.1 10*3/uL (ref 0.0–0.4)
Eos: 1 %
Hematocrit: 42.5 % (ref 37.5–51.0)
Hemoglobin: 14.6 g/dL (ref 13.0–17.7)
Immature Grans (Abs): 0 10*3/uL (ref 0.0–0.1)
Immature Granulocytes: 0 %
LYMPHS ABS: 2.5 10*3/uL (ref 0.7–3.1)
Lymphs: 27 %
MCH: 31 pg (ref 26.6–33.0)
MCHC: 34.4 g/dL (ref 31.5–35.7)
MCV: 90 fL (ref 79–97)
MONOS ABS: 0.4 10*3/uL (ref 0.1–0.9)
Monocytes: 4 %
Neutrophils Absolute: 6.1 10*3/uL (ref 1.4–7.0)
Neutrophils: 68 %
Platelets: 265 10*3/uL (ref 150–379)
RBC: 4.71 x10E6/uL (ref 4.14–5.80)
RDW: 13.4 % (ref 12.3–15.4)
WBC: 9.1 10*3/uL (ref 3.4–10.8)

## 2016-03-23 LAB — COMPREHENSIVE METABOLIC PANEL
ALK PHOS: 56 IU/L (ref 39–117)
ALT: 13 IU/L (ref 0–44)
AST: 15 IU/L (ref 0–40)
Albumin/Globulin Ratio: 2.1 (ref 1.2–2.2)
Albumin: 4.8 g/dL (ref 3.5–5.5)
BUN / CREAT RATIO: 15 (ref 9–20)
BUN: 15 mg/dL (ref 6–24)
Bilirubin Total: 0.3 mg/dL (ref 0.0–1.2)
CHLORIDE: 99 mmol/L (ref 96–106)
CO2: 22 mmol/L (ref 18–29)
CREATININE: 1 mg/dL (ref 0.76–1.27)
Calcium: 9.7 mg/dL (ref 8.7–10.2)
GFR, EST AFRICAN AMERICAN: 105 mL/min/{1.73_m2} (ref >59)
GFR, EST NON AFRICAN AMERICAN: 90 mL/min/{1.73_m2} (ref >59)
GLUCOSE: 96 mg/dL (ref 65–99)
Globulin, Total: 2.3 g/dL (ref 1.5–4.5)
POTASSIUM: 4.2 mmol/L (ref 3.5–5.2)
Sodium: 143 mmol/L (ref 134–144)
Total Protein: 7.1 g/dL (ref 6.0–8.5)

## 2016-03-23 LAB — LIPID PANEL
CHOL/HDL RATIO: 6 ratio — AB (ref 0.0–5.0)
Cholesterol, Total: 221 mg/dL — ABNORMAL HIGH (ref 100–199)
HDL: 37 mg/dL — AB (ref >39)
LDL Calculated: 155 mg/dL — ABNORMAL HIGH (ref 0–99)
Triglycerides: 144 mg/dL (ref 0–149)
VLDL Cholesterol Cal: 29 mg/dL (ref 5–40)

## 2016-03-23 LAB — PSA: PROSTATE SPECIFIC AG, SERUM: 0.5 ng/mL (ref 0.0–4.0)

## 2016-03-23 LAB — TSH: TSH: 1.52 u[IU]/mL (ref 0.450–4.500)

## 2016-09-12 ENCOUNTER — Other Ambulatory Visit: Payer: Self-pay | Admitting: Unknown Physician Specialty

## 2016-09-13 NOTE — Telephone Encounter (Signed)
Your patient.  Thanks 

## 2016-10-19 ENCOUNTER — Ambulatory Visit (INDEPENDENT_AMBULATORY_CARE_PROVIDER_SITE_OTHER): Payer: 59 | Admitting: Family Medicine

## 2016-10-19 ENCOUNTER — Encounter: Payer: Self-pay | Admitting: Family Medicine

## 2016-10-19 DIAGNOSIS — F3342 Major depressive disorder, recurrent, in full remission: Secondary | ICD-10-CM | POA: Diagnosis not present

## 2016-10-19 DIAGNOSIS — M7051 Other bursitis of knee, right knee: Secondary | ICD-10-CM | POA: Diagnosis not present

## 2016-10-19 MED ORDER — FLUOXETINE HCL 20 MG PO TABS: 20.0000 mg | ORAL_TABLET | Freq: Every day | ORAL | 2 refills | Status: DC

## 2016-10-19 NOTE — Progress Notes (Signed)
BP 125/78   Pulse 62   Wt 148 lb (67.1 kg)   SpO2 99%   BMI 22.96 kg/m    Subjective:    Patient ID: Daniel Lawson, male    DOB: 03-29-1970, 46 y.o.   MRN: [id number]  HPI: Daniel Lawson is a 46 y.o. male  Chief Complaint  Patient presents with  . Follow-up  . 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 Patient has been able to stay off his nieces last few weeks.   Relevant past medical, surgical, family and social history reviewed and updated as indicated. Interim medical history since our last visit reviewed. Allergies and medications reviewed and updated.  Review of Systems  Constitutional: Negative.   Respiratory: Negative.   Cardiovascular: Negative.     Per HPI unless specifically indicated above     Objective:    BP 125/78   Pulse 62   Wt 148 lb (67.1 kg)   SpO2 99%   BMI 22.96 kg/m   Wt Readings from Last 3 Encounters:  10/19/16 148 lb (67.1 kg)  03/22/16 151 lb (68.5 kg)  09/17/15 149 lb (67.6 kg)    Physical Exam  Constitutional: He is oriented to person, place, and time. He appears well-developed and well-nourished.  HENT:  Head: Normocephalic and atraumatic.  Eyes: Conjunctivae and EOM are normal.  Neck: Normal range of motion.  Cardiovascular: Normal rate, regular rhythm and normal heart sounds.   Pulmonary/Chest: Effort normal and breath sounds normal.  Musculoskeletal: Normal range of motion.  Swollen right prepatellar bursa  Neurological: He is alert and oriented to person, place, and time.  Skin: No erythema.  Psychiatric: He has a normal mood and affect. His behavior is normal. Judgment and thought content normal.    Results for orders placed or performed in visit on 03/22/16  CBC with Differential/Platelet  Result Value Ref Range   WBC 9.1 3.4 - 10.8 x10E3/uL     RBC 4.71 4.14 - 5.80 x10E6/uL   Hemoglobin 14.6 13.0 - 17.7 g/dL   Hematocrit 40.9 81.1 - 51.0 %   MCV 90 79 - 97 fL   MCH 31.0 26.6 - 33.0 pg   MCHC 34.4 31.5 - 35.7 g/dL   RDW 91.4 78.2 - 95.6 %   Platelets 265 150 - 379 x10E3/uL   Neutrophils 68 Not Estab. %   Lymphs 27 Not Estab. %   Monocytes 4 Not Estab. %   Eos 1 Not Estab. %   Basos 0 Not Estab. %   Neutrophils Absolute 6.1 1.4 - 7.0 x10E3/uL   Lymphocytes Absolute 2.5 0.7 - 3.1 x10E3/uL   Monocytes Absolute 0.4 0.1 - 0.9 x10E3/uL   EOS (ABSOLUTE) 0.1 0.0 - 0.4 x10E3/uL   Basophils Absolute 0.0 0.0 - 0.2 x10E3/uL   Immature Granulocytes 0 Not Estab. %   Immature Grans (Abs) 0.0 0.0 - 0.1 x10E3/uL  Comprehensive metabolic panel  Result Value Ref Range   Glucose 96 65 - 99 mg/dL   BUN 15 6 - 24 mg/dL   Creatinine, Ser 2.13 0.76 - 1.27 mg/dL   GFR calc non Af Amer 90 >59 mL/min/1.73   GFR calc Af Amer 105 >59 mL/min/1.73   BUN/Creatinine Ratio 15 9 - 20   Sodium 143 134 - 144 mmol/L   Potassium 4.2 3.5 -  5.2 mmol/L   Chloride 99 96 - 106 mmol/L   CO2 22 18 - 29 mmol/L   Calcium 9.7 8.7 - 10.2 mg/dL   Total Protein 7.1 6.0 - 8.5 g/dL   Albumin 4.8 3.5 - 5.5 g/dL   Globulin, Total 2.3 1.5 - 4.5 g/dL   Albumin/Globulin Ratio 2.1 1.2 - 2.2   Bilirubin Total 0.3 0.0 - 1.2 mg/dL   Alkaline Phosphatase 56 39 - 117 IU/L   AST 15 0 - 40 IU/L   ALT 13 0 - 44 IU/L  Lipid panel  Result Value Ref Range   Cholesterol, Total 221 (H) 100 - 199 mg/dL   Triglycerides 782 0 - 149 mg/dL   HDL 37 (L) >95 mg/dL   VLDL Cholesterol Cal 29 5 - 40 mg/dL   LDL Calculated 621 (H) 0 - 99 mg/dL   Chol/HDL Ratio 6.0 (H) 0.0 - 5.0 ratio units  PSA  Result Value Ref Range   Prostate Specific Ag, Serum 0.5 0.0 - 4.0 ng/mL  TSH  Result Value Ref Range   TSH 1.520 0.450 - 4.500 uIU/mL  Urinalysis, Routine w reflex microscopic  Result Value Ref Range   Specific Gravity, UA <1.005 (L) 1.005 - 1.030   pH, UA 6.0 5.0 - 7.5   Color, UA  Yellow Yellow   Appearance Ur Clear Clear   Leukocytes, UA Negative Negative   Protein, UA Negative Negative/Trace   Glucose, UA Negative Negative   Ketones, UA Negative Negative   RBC, UA Negative Negative   Bilirubin, UA Negative Negative   Urobilinogen, Ur 0.2 0.2 - 1.0 mg/dL   Nitrite, UA Negative Negative      Assessment & Plan:   Problem List Items Addressed This Visit      Musculoskeletal and Integument   Patellar bursitis of right knee    Discussed care and treatment especially limiting pressure on his knee.        Other   Depression    The current medical regimen is effective;  continue present plan and medications.       Relevant Medications   FLUoxetine (PROZAC) 20 MG tablet       Follow up plan: Return in about 6 months (around 04/18/2017) for Physical Exam.

## 2016-10-19 NOTE — Assessment & Plan Note (Signed)
Discussed care and treatment especially limiting pressure on his knee.

## 2016-10-19 NOTE — Assessment & Plan Note (Signed)
The current medical regimen is effective;  continue present plan and medications.  

## 2017-03-03 ENCOUNTER — Encounter: Payer: Self-pay | Admitting: Family Medicine

## 2017-03-29 ENCOUNTER — Encounter: Payer: 59 | Admitting: Family Medicine

## 2017-04-20 ENCOUNTER — Encounter: Payer: Self-pay | Admitting: Family Medicine

## 2017-04-20 ENCOUNTER — Ambulatory Visit (INDEPENDENT_AMBULATORY_CARE_PROVIDER_SITE_OTHER): Payer: 59 | Admitting: Family Medicine

## 2017-04-20 VITALS — BP 136/81 | HR 69 | Ht 66.93 in | Wt 158.0 lb

## 2017-04-20 DIAGNOSIS — F3342 Major depressive disorder, recurrent, in full remission: Secondary | ICD-10-CM

## 2017-04-20 DIAGNOSIS — R7309 Other abnormal glucose: Secondary | ICD-10-CM | POA: Diagnosis not present

## 2017-04-20 DIAGNOSIS — I899 Noninfective disorder of lymphatic vessels and lymph nodes, unspecified: Secondary | ICD-10-CM

## 2017-04-20 DIAGNOSIS — Z1329 Encounter for screening for other suspected endocrine disorder: Secondary | ICD-10-CM

## 2017-04-20 DIAGNOSIS — Z0001 Encounter for general adult medical examination with abnormal findings: Secondary | ICD-10-CM | POA: Diagnosis not present

## 2017-04-20 DIAGNOSIS — Z125 Encounter for screening for malignant neoplasm of prostate: Secondary | ICD-10-CM | POA: Diagnosis not present

## 2017-04-20 DIAGNOSIS — E7800 Pure hypercholesterolemia, unspecified: Secondary | ICD-10-CM | POA: Diagnosis not present

## 2017-04-20 DIAGNOSIS — G4733 Obstructive sleep apnea (adult) (pediatric): Secondary | ICD-10-CM | POA: Insufficient documentation

## 2017-04-20 LAB — URINALYSIS, ROUTINE W REFLEX MICROSCOPIC
Bilirubin, UA: NEGATIVE
GLUCOSE, UA: NEGATIVE
Ketones, UA: NEGATIVE
NITRITE UA: NEGATIVE
PH UA: 5.5 (ref 5.0–7.5)
Protein, UA: NEGATIVE
RBC, UA: NEGATIVE
Specific Gravity, UA: 1.015 (ref 1.005–1.030)
Urobilinogen, Ur: 0.2 mg/dL (ref 0.2–1.0)

## 2017-04-20 LAB — MICROSCOPIC EXAMINATION
BACTERIA UA: NONE SEEN
RBC MICROSCOPIC, UA: NONE SEEN /HPF (ref 0–2)

## 2017-04-20 MED ORDER — FLUOXETINE HCL 20 MG PO TABS: 20.0000 mg | ORAL_TABLET | Freq: Every day | ORAL | 2 refills | Status: DC

## 2017-04-20 NOTE — Assessment & Plan Note (Signed)
Small little lymph node left neck area proximally half a centimeter easily movable will observe if still present in a couple of months will further evaluate.

## 2017-04-20 NOTE — Progress Notes (Signed)
BP 136/81   Pulse 69   Ht 5' 6.93" (1.7 m)   Wt 158 lb (71.7 kg)   SpO2 98%   BMI 24.80 kg/m    Subjective:    Patient ID: Daniel Lawson, male    DOB: 02/19/1970, 47 y.o.   MRN: [id number]  HPI: Daniel Mosheratrick O Luu is a 47 y.o. male  Chief Complaint  Patient presents with  . Annual Exam  . Fatigue  . Night Sweats   With marked complaints of daytime drowsiness observed sleep apnea by his wife dry mouth night sweats and daytime naps falls asleep very easily.  Had a sleep study years ago which was reported is negative patient with worsening and different symptoms this time. Otherwise stable on meds She is also a small lymph node in left neck area just noticed by palpation this last week no known trauma irritation.  Not tender Relevant past medical, surgical, family and social history reviewed and updated as indicated. Interim medical history since our last visit reviewed. Allergies and medications reviewed and updated.  Review of Systems  Constitutional: Negative.   HENT: Negative.   Eyes: Negative.   Respiratory: Negative.   Cardiovascular: Negative.   Gastrointestinal: Negative.   Endocrine: Negative.   Genitourinary: Negative.   Musculoskeletal: Negative.   Skin: Negative.   Allergic/Immunologic: Negative.   Neurological: Negative.   Hematological: Negative.   Psychiatric/Behavioral: Negative.     Per HPI unless specifically indicated above     Objective:    BP 136/81   Pulse 69   Ht 5' 6.93" (1.7 m)   Wt 158 lb (71.7 kg)   SpO2 98%   BMI 24.80 kg/m   Wt Readings from Last 3 Encounters:  04/20/17 158 lb (71.7 kg)  10/19/16 148 lb (67.1 kg)  03/22/16 151 lb (68.5 kg)    Physical Exam  Constitutional: He is oriented to person, place, and time. He appears well-developed and well-nourished.  HENT:  Head: Normocephalic and atraumatic.  Right Ear: External ear normal.  Left Ear: External ear normal.  Small little lymph node left neck area proximally half  a centimeter easily movable will observe if still present in a couple of months will further evaluate.  Eyes: Pupils are equal, round, and reactive to light. Conjunctivae and EOM are normal.  Neck: Normal range of motion. Neck supple.  Cardiovascular: Normal rate, regular rhythm, normal heart sounds and intact distal pulses.  Pulmonary/Chest: Effort normal and breath sounds normal.  Abdominal: Soft. Bowel sounds are normal. There is no splenomegaly or hepatomegaly.  Genitourinary: Rectum normal, prostate normal and penis normal.  Musculoskeletal: Normal range of motion.  Neurological: He is alert and oriented to person, place, and time. He has normal reflexes.  Skin: No rash noted. No erythema.  Psychiatric: He has a normal mood and affect. His behavior is normal. Judgment and thought content normal.    Results for orders placed or performed in visit on 03/22/16  CBC with Differential/Platelet  Result Value Ref Range   WBC 9.1 3.4 - 10.8 x10E3/uL   RBC 4.71 4.14 - 5.80 x10E6/uL   Hemoglobin 14.6 13.0 - 17.7 g/dL   Hematocrit 29.542.5 62.137.5 - 51.0 %   MCV 90 79 - 97 fL   MCH 31.0 26.6 - 33.0 pg   MCHC 34.4 31.5 - 35.7 g/dL   RDW 30.813.4 65.712.3 - 84.615.4 %   Platelets 265 150 - 379 x10E3/uL   Neutrophils 68 Not Estab. %   Lymphs 27  Not Estab. %   Monocytes 4 Not Estab. %   Eos 1 Not Estab. %   Basos 0 Not Estab. %   Neutrophils Absolute 6.1 1.4 - 7.0 x10E3/uL   Lymphocytes Absolute 2.5 0.7 - 3.1 x10E3/uL   Monocytes Absolute 0.4 0.1 - 0.9 x10E3/uL   EOS (ABSOLUTE) 0.1 0.0 - 0.4 x10E3/uL   Basophils Absolute 0.0 0.0 - 0.2 x10E3/uL   Immature Granulocytes 0 Not Estab. %   Immature Grans (Abs) 0.0 0.0 - 0.1 x10E3/uL  Comprehensive metabolic panel  Result Value Ref Range   Glucose 96 65 - 99 mg/dL   BUN 15 6 - 24 mg/dL   Creatinine, Ser 1.61 0.76 - 1.27 mg/dL   GFR calc non Af Amer 90 >59 mL/min/1.73   GFR calc Af Amer 105 >59 mL/min/1.73   BUN/Creatinine Ratio 15 9 - 20   Sodium 143 134 -  144 mmol/L   Potassium 4.2 3.5 - 5.2 mmol/L   Chloride 99 96 - 106 mmol/L   CO2 22 18 - 29 mmol/L   Calcium 9.7 8.7 - 10.2 mg/dL   Total Protein 7.1 6.0 - 8.5 g/dL   Albumin 4.8 3.5 - 5.5 g/dL   Globulin, Total 2.3 1.5 - 4.5 g/dL   Albumin/Globulin Ratio 2.1 1.2 - 2.2   Bilirubin Total 0.3 0.0 - 1.2 mg/dL   Alkaline Phosphatase 56 39 - 117 IU/L   AST 15 0 - 40 IU/L   ALT 13 0 - 44 IU/L  Lipid panel  Result Value Ref Range   Cholesterol, Total 221 (H) 100 - 199 mg/dL   Triglycerides 096 0 - 149 mg/dL   HDL 37 (L) >04 mg/dL   VLDL Cholesterol Cal 29 5 - 40 mg/dL   LDL Calculated 540 (H) 0 - 99 mg/dL   Chol/HDL Ratio 6.0 (H) 0.0 - 5.0 ratio units  PSA  Result Value Ref Range   Prostate Specific Ag, Serum 0.5 0.0 - 4.0 ng/mL  TSH  Result Value Ref Range   TSH 1.520 0.450 - 4.500 uIU/mL  Urinalysis, Routine w reflex microscopic  Result Value Ref Range   Specific Gravity, UA <1.005 (L) 1.005 - 1.030   pH, UA 6.0 5.0 - 7.5   Color, UA Yellow Yellow   Appearance Ur Clear Clear   Leukocytes, UA Negative Negative   Protein, UA Negative Negative/Trace   Glucose, UA Negative Negative   Ketones, UA Negative Negative   RBC, UA Negative Negative   Bilirubin, UA Negative Negative   Urobilinogen, Ur 0.2 0.2 - 1.0 mg/dL   Nitrite, UA Negative Negative      Assessment & Plan:   Problem List Items Addressed This Visit      Respiratory   OSA (obstructive sleep apnea)    Pt with OSA sx will do sleep study      Relevant Orders   Ambulatory referral to Sleep Studies     Immune and Lymphatic   Lymph node disorder    Small little lymph node left neck area proximally half a centimeter easily movable will observe if still present in a couple of months will further evaluate.        Other   Depression    The current medical regimen is effective;  continue present plan and medications.       Relevant Medications   FLUoxetine (PROZAC) 20 MG tablet   Hypercholesterolemia - Primary     The current medical regimen is effective;  continue present plan  and medications.       Relevant Orders   CBC with Differential/Platelet   Comprehensive metabolic panel   Lipid panel   Urinalysis, Routine w reflex microscopic    Other Visit Diagnoses    Prostate cancer screening       Relevant Orders   PSA   Thyroid disorder screen       Relevant Orders   TSH   Elevated glucose       Relevant Orders   CBC with Differential/Platelet   Comprehensive metabolic panel   Lipid panel   Urinalysis, Routine w reflex microscopic   Elevated cholesterol       Relevant Orders   CBC with Differential/Platelet   Comprehensive metabolic panel   Lipid panel   Urinalysis, Routine w reflex microscopic       Follow up plan: Return in about 1 year (around 04/21/2018) for Physical Exam.

## 2017-04-20 NOTE — Assessment & Plan Note (Signed)
The current medical regimen is effective;  continue present plan and medications.  

## 2017-04-20 NOTE — Assessment & Plan Note (Signed)
Pt with OSA sx will do sleep study

## 2017-04-21 ENCOUNTER — Encounter: Payer: Self-pay | Admitting: Family Medicine

## 2017-04-21 LAB — CBC WITH DIFFERENTIAL/PLATELET
BASOS: 1 %
Basophils Absolute: 0.1 10*3/uL (ref 0.0–0.2)
EOS (ABSOLUTE): 0.1 10*3/uL (ref 0.0–0.4)
Eos: 1 %
Hematocrit: 40.5 % (ref 37.5–51.0)
Hemoglobin: 14.1 g/dL (ref 13.0–17.7)
IMMATURE GRANULOCYTES: 0 %
Immature Grans (Abs): 0 10*3/uL (ref 0.0–0.1)
LYMPHS ABS: 2.6 10*3/uL (ref 0.7–3.1)
Lymphs: 32 %
MCH: 31.2 pg (ref 26.6–33.0)
MCHC: 34.8 g/dL (ref 31.5–35.7)
MCV: 90 fL (ref 79–97)
MONOS ABS: 0.4 10*3/uL (ref 0.1–0.9)
Monocytes: 5 %
NEUTROS PCT: 61 %
Neutrophils Absolute: 5.1 10*3/uL (ref 1.4–7.0)
PLATELETS: 285 10*3/uL (ref 150–379)
RBC: 4.52 x10E6/uL (ref 4.14–5.80)
RDW: 13.3 % (ref 12.3–15.4)
WBC: 8.3 10*3/uL (ref 3.4–10.8)

## 2017-04-21 LAB — PSA: Prostate Specific Ag, Serum: 2.1 ng/mL (ref 0.0–4.0)

## 2017-04-21 LAB — COMPREHENSIVE METABOLIC PANEL
A/G RATIO: 1.7 (ref 1.2–2.2)
ALK PHOS: 57 IU/L (ref 39–117)
ALT: 15 IU/L (ref 0–44)
AST: 16 IU/L (ref 0–40)
Albumin: 4.3 g/dL (ref 3.5–5.5)
BILIRUBIN TOTAL: 0.2 mg/dL (ref 0.0–1.2)
BUN/Creatinine Ratio: 12 (ref 9–20)
BUN: 13 mg/dL (ref 6–24)
CALCIUM: 9.5 mg/dL (ref 8.7–10.2)
CHLORIDE: 104 mmol/L (ref 96–106)
CO2: 22 mmol/L (ref 20–29)
Creatinine, Ser: 1.07 mg/dL (ref 0.76–1.27)
GFR calc Af Amer: 96 mL/min/{1.73_m2} (ref >59)
GFR, EST NON AFRICAN AMERICAN: 83 mL/min/{1.73_m2} (ref >59)
Globulin, Total: 2.5 g/dL (ref 1.5–4.5)
Glucose: 105 mg/dL — ABNORMAL HIGH (ref 65–99)
POTASSIUM: 4 mmol/L (ref 3.5–5.2)
SODIUM: 143 mmol/L (ref 134–144)
Total Protein: 6.8 g/dL (ref 6.0–8.5)

## 2017-04-21 LAB — LIPID PANEL
CHOLESTEROL TOTAL: 195 mg/dL (ref 100–199)
Chol/HDL Ratio: 4.8 ratio (ref 0.0–5.0)
HDL: 41 mg/dL (ref >39)
LDL Calculated: 134 mg/dL — ABNORMAL HIGH (ref 0–99)
TRIGLYCERIDES: 102 mg/dL (ref 0–149)
VLDL Cholesterol Cal: 20 mg/dL (ref 5–40)

## 2017-04-21 LAB — TSH: TSH: 1.28 u[IU]/mL (ref 0.450–4.500)

## 2017-11-13 IMAGING — CR DG CHEST 2V
1 series · 2 of 2 positions shown · non-contrast
Comparison: PA and lateral chest x-ray February 25, 2012

CLINICAL DATA: Asbestos exposure approximately 6 months ago, onset
of nonproductive cough and shortness of breath and chest tightness
since then, difficulty taking a deep breath in the morning without
beginning to cough; history of asthma, current smoker.

EXAM:
CHEST  2 VIEW

[Series 1: pa · 0.17mm/px · 2 of 2 slices shown]
[im 1/2]
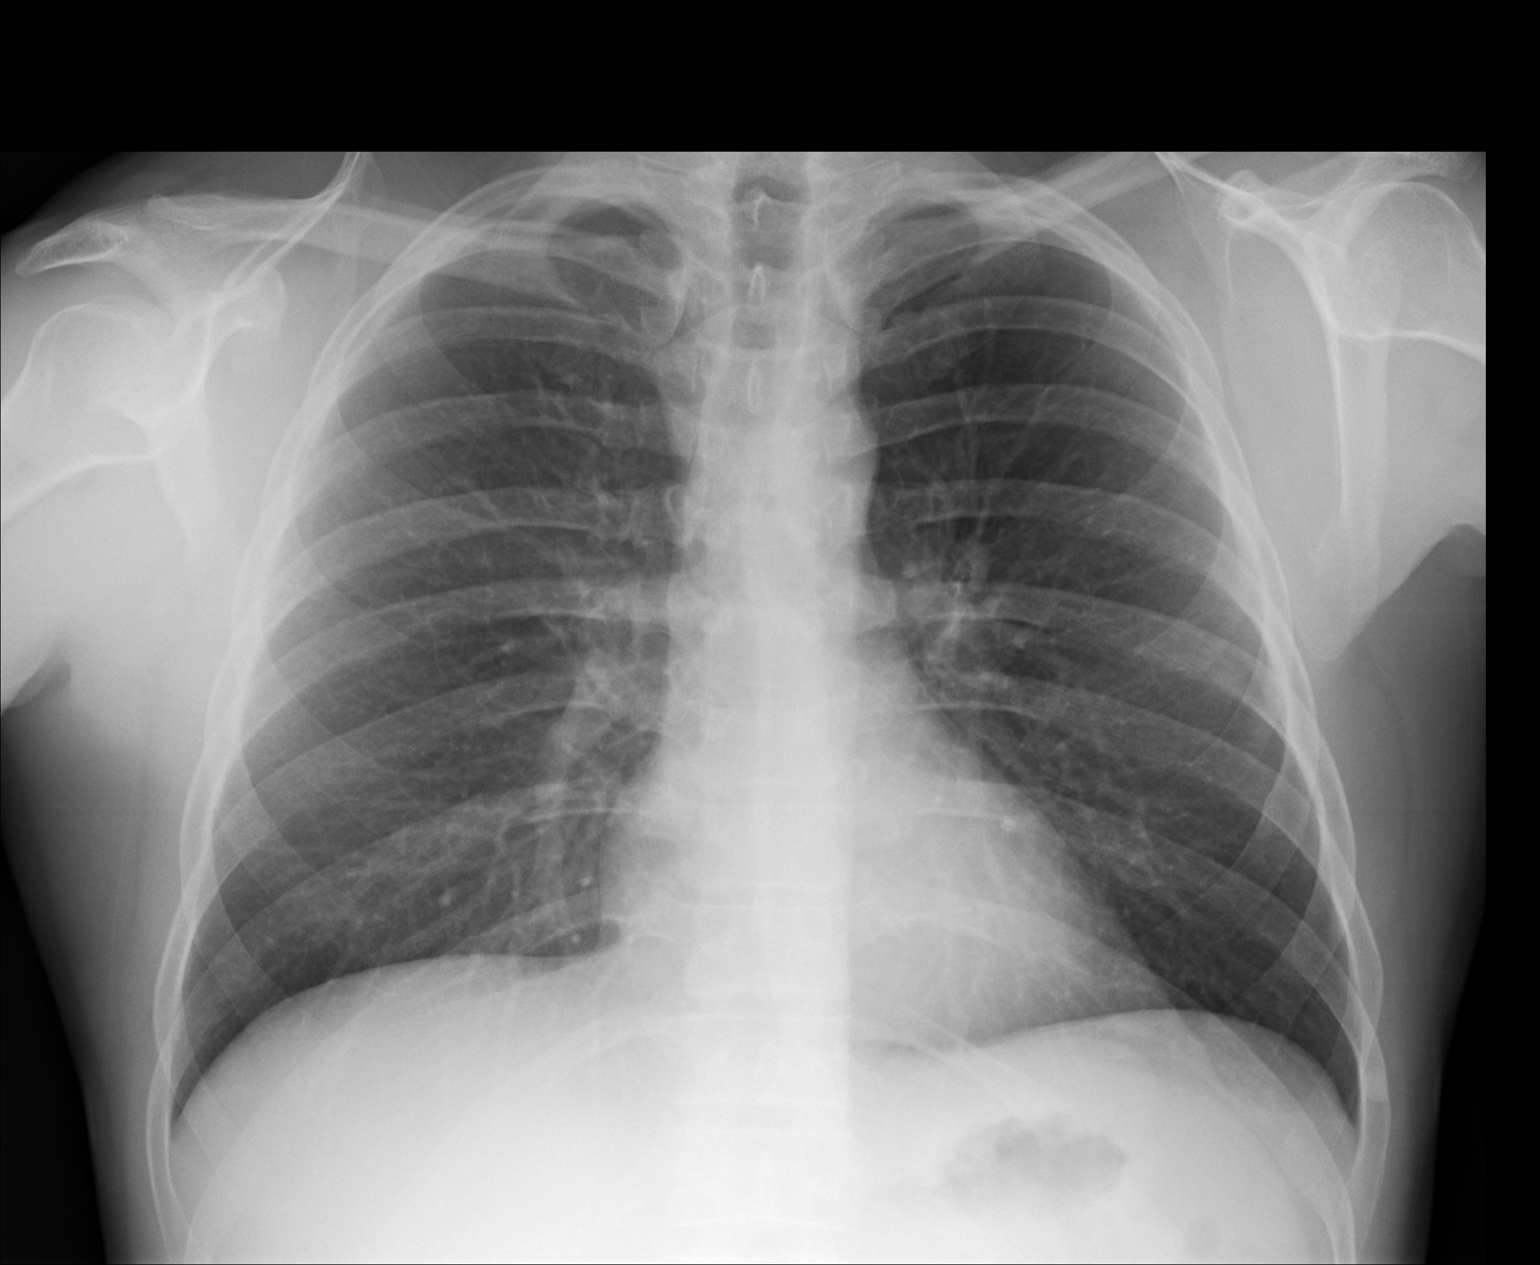
[im 2/2]
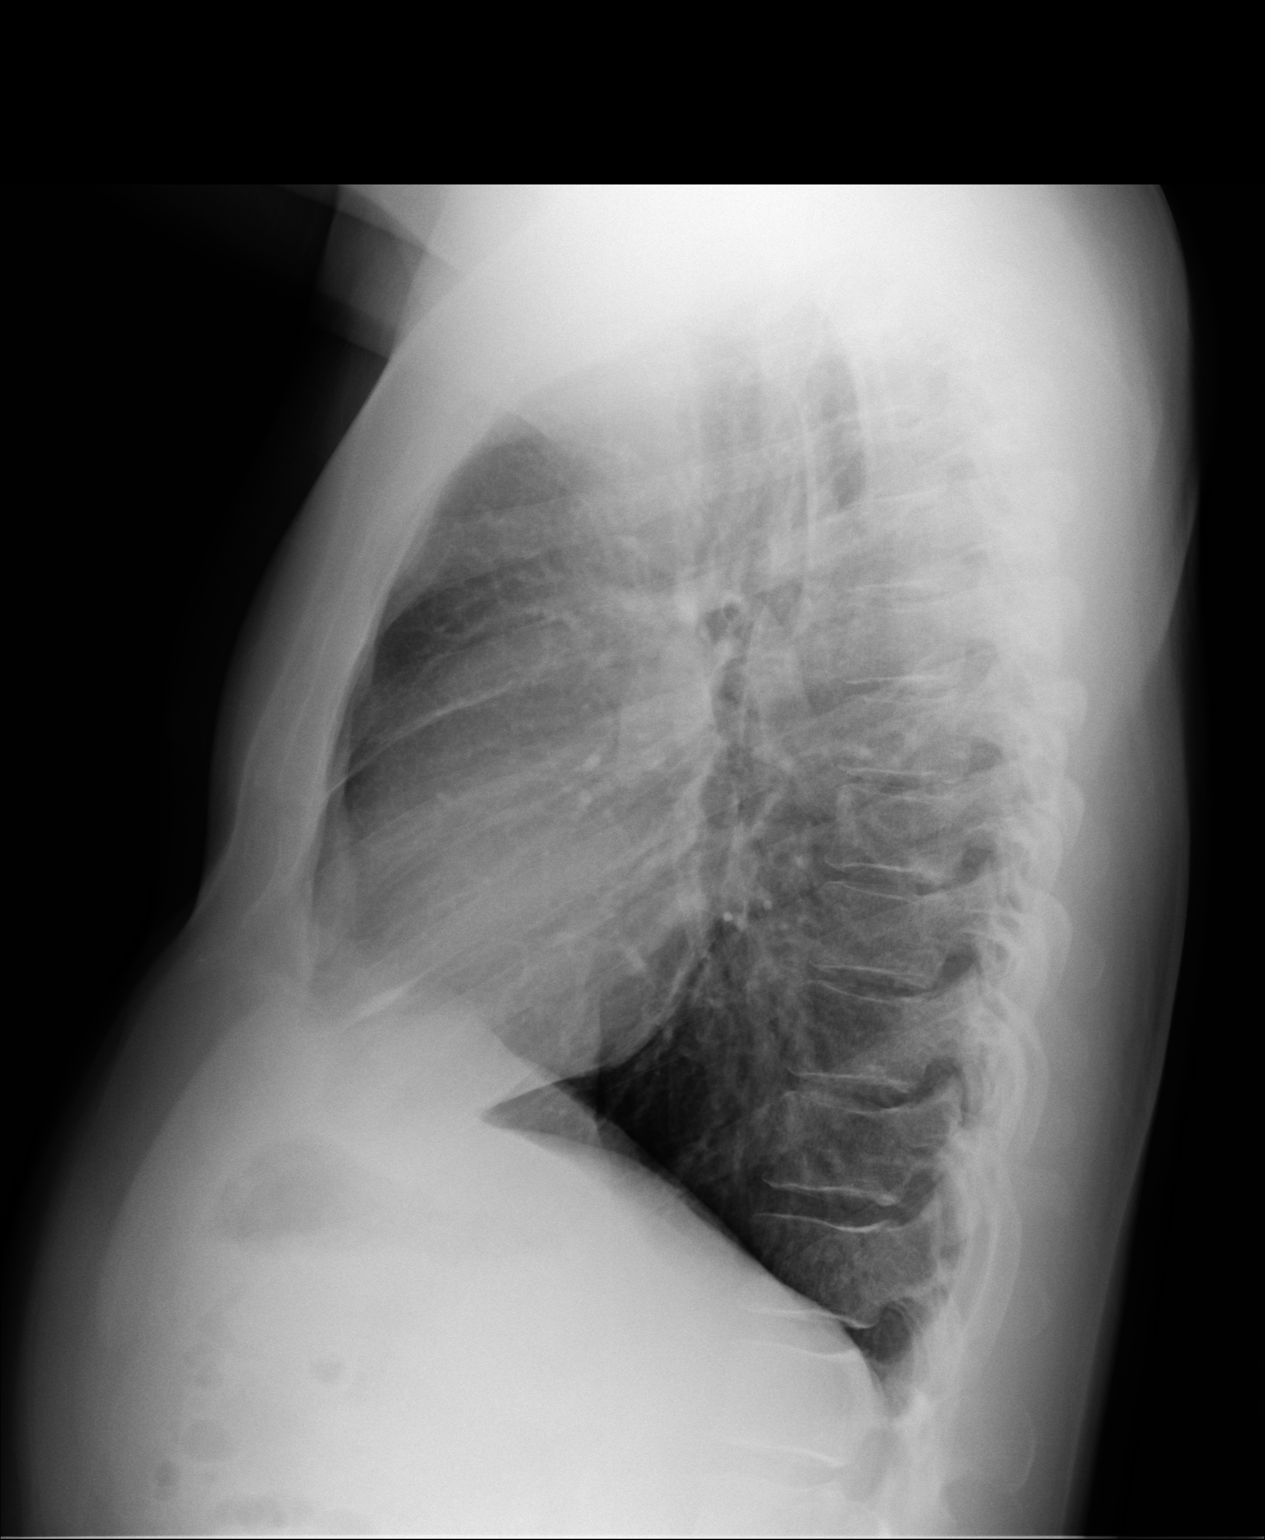

[2 of 2 positions shown; findings below may reference images not displayed]

FINDINGS: The lungs are mildly hyperinflated with hemidiaphragm flattening.
There is no focal infiltrate. There is no pleural effusion. No
pleural plaques are observed. The pulmonary interstitial markings
are not abnormally increased. The heart and pulmonary vascularity
are normal. The bony thorax exhibits no acute abnormality.
IMPRESSION: Mild chronic hyperinflation consistent with reactive airway disease
and the patient's smoking history. There are no radiographic
findings to suggest asbestos exposure. There is no acute
cardiopulmonary abnormality.

## 2018-01-04 ENCOUNTER — Encounter: Payer: Self-pay | Admitting: Family Medicine

## 2018-01-04 ENCOUNTER — Ambulatory Visit: Payer: 59 | Admitting: Family Medicine

## 2018-01-04 VITALS — BP 123/81 | HR 70 | Temp 98.0°F | Wt 161.0 lb

## 2018-01-04 DIAGNOSIS — R5383 Other fatigue: Secondary | ICD-10-CM | POA: Diagnosis not present

## 2018-01-04 DIAGNOSIS — F331 Major depressive disorder, recurrent, moderate: Secondary | ICD-10-CM

## 2018-01-04 DIAGNOSIS — J4541 Moderate persistent asthma with (acute) exacerbation: Secondary | ICD-10-CM | POA: Diagnosis not present

## 2018-01-04 DIAGNOSIS — F339 Major depressive disorder, recurrent, unspecified: Secondary | ICD-10-CM | POA: Insufficient documentation

## 2018-01-04 MED ORDER — BUPROPION HCL ER (XL) 150 MG PO TB24: 150.0000 mg | ORAL_TABLET | Freq: Every day | ORAL | 0 refills | Status: DC

## 2018-01-04 MED ORDER — PREDNISONE 10 MG PO TABS: ORAL_TABLET | ORAL | 0 refills | Status: DC

## 2018-01-04 MED ORDER — BUDESONIDE-FORMOTEROL FUMARATE 160-4.5 MCG/ACT IN AERO: 2.0000 | INHALATION_SPRAY | Freq: Two times a day (BID) | RESPIRATORY_TRACT | 3 refills | Status: DC

## 2018-01-04 MED ORDER — ALBUTEROL SULFATE HFA 108 (90 BASE) MCG/ACT IN AERS: 2.0000 | INHALATION_SPRAY | Freq: Four times a day (QID) | RESPIRATORY_TRACT | 3 refills | Status: DC | PRN

## 2018-01-04 NOTE — Assessment & Plan Note (Signed)
No improvement on prozac. Will trial wellbutrin and monitor closely for benefit. Risks and side effects reviewed

## 2018-01-04 NOTE — Assessment & Plan Note (Addendum)
Start symbicort BID and prednisone taper, humidifier at bedtime, and albuterol prn. Strongly encouraged smoking cessation, pt declines at this time.
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
## 2018-01-05 LAB — CBC WITH DIFFERENTIAL/PLATELET
BASOS ABS: 0.1 10*3/uL (ref 0.0–0.2)
Basos: 1 %
EOS (ABSOLUTE): 0.1 10*3/uL (ref 0.0–0.4)
Eos: 1 %
HEMATOCRIT: 39.9 % (ref 37.5–51.0)
HEMOGLOBIN: 14.1 g/dL (ref 13.0–17.7)
IMMATURE GRANULOCYTES: 0 %
Immature Grans (Abs): 0 10*3/uL (ref 0.0–0.1)
Lymphocytes Absolute: 2.6 10*3/uL (ref 0.7–3.1)
Lymphs: 27 %
MCH: 31.8 pg (ref 26.6–33.0)
MCHC: 35.3 g/dL (ref 31.5–35.7)
MCV: 90 fL (ref 79–97)
MONOCYTES: 7 %
MONOS ABS: 0.7 10*3/uL (ref 0.1–0.9)
Neutrophils Absolute: 6.2 10*3/uL (ref 1.4–7.0)
Neutrophils: 64 %
PLATELETS: 285 10*3/uL (ref 150–450)
RBC: 4.43 x10E6/uL (ref 4.14–5.80)
RDW: 12.4 % (ref 12.3–15.4)
WBC: 9.7 10*3/uL (ref 3.4–10.8)

## 2018-01-05 LAB — LYME AB/WESTERN BLOT REFLEX: Lyme IgG/IgM Ab: <0.91 {ISR} (ref 0.00–0.90)

## 2018-01-05 LAB — VITAMIN D 25 HYDROXY (VIT D DEFICIENCY, FRACTURES): VIT D 25 HYDROXY: 26.4 ng/mL — AB (ref 30.0–100.0)

## 2018-01-05 LAB — TSH: TSH: 2.03 u[IU]/mL (ref 0.450–4.500)

## 2018-01-05 LAB — VITAMIN B12: Vitamin B-12: 572 pg/mL (ref 232–1245)

## 2018-01-06 ENCOUNTER — Encounter: Payer: Self-pay | Admitting: Family Medicine

## 2018-01-06 DIAGNOSIS — E559 Vitamin D deficiency, unspecified: Secondary | ICD-10-CM | POA: Insufficient documentation

## 2018-01-26 ENCOUNTER — Other Ambulatory Visit: Payer: Self-pay | Admitting: Family Medicine

## 2018-02-16 ENCOUNTER — Encounter: Payer: Self-pay | Admitting: Family Medicine

## 2018-02-16 ENCOUNTER — Ambulatory Visit: Payer: 59 | Admitting: Family Medicine

## 2018-02-16 DIAGNOSIS — J4541 Moderate persistent asthma with (acute) exacerbation: Secondary | ICD-10-CM | POA: Diagnosis not present

## 2018-02-16 DIAGNOSIS — T8489XS Other specified complication of internal orthopedic prosthetic devices, implants and grafts, sequela: Secondary | ICD-10-CM

## 2018-02-16 DIAGNOSIS — E559 Vitamin D deficiency, unspecified: Secondary | ICD-10-CM | POA: Diagnosis not present

## 2018-02-16 DIAGNOSIS — F3342 Major depressive disorder, recurrent, in full remission: Secondary | ICD-10-CM

## 2018-02-16 MED ORDER — BUPROPION HCL ER (XL) 150 MG PO TB24: 150.0000 mg | ORAL_TABLET | Freq: Every day | ORAL | 2 refills | Status: AC

## 2018-02-16 NOTE — Assessment & Plan Note (Signed)
Discussed use of over-the-counter medications

## 2018-02-16 NOTE — Progress Notes (Signed)
BP 136/80   Pulse 83   Ht 5' 6.9" (1.699 m)   Wt 158 lb (71.7 kg)   SpO2 99%   BMI 24.82 kg/m    Subjective:    Patient ID: Daniel Lawson, male    DOB: 1970/12/14, 48 y.o.   MRN: [id number]  HPI: Daniel Lawson is a 48 y.o. male  Chief Complaint  Patient presents with  . Follow-up  . Cough    Better.   . Mood  Patient follow-up cough much better still using inhalers but looking to slow down. Fatigue proved with Wellbutrin patient felt better less irritability has run out but wants to get that back. Reviewed low vitamin D levels over-the-counter replacement with medications which patient has not started yet  Relevant past medical, surgical, family and social history reviewed and updated as indicated. Interim medical history since our last visit reviewed. Allergies and medications reviewed and updated.  Review of Systems  Constitutional: Negative.   Respiratory: Negative.   Cardiovascular: Negative.     Per HPI unless specifically indicated above     Objective:    BP 136/80   Pulse 83   Ht 5' 6.9" (1.699 m)   Wt 158 lb (71.7 kg)   SpO2 99%   BMI 24.82 kg/m   Wt Readings from Last 3 Encounters:  02/16/18 158 lb (71.7 kg)  01/04/18 161 lb (73 kg)  04/20/17 158 lb (71.7 kg)    Physical Exam Constitutional:      Appearance: He is well-developed.  HENT:     Head: Normocephalic and atraumatic.  Eyes:     Conjunctiva/sclera: Conjunctivae normal.  Neck:     Musculoskeletal: Normal range of motion.  Cardiovascular:     Rate and Rhythm: Normal rate and regular rhythm.     Heart sounds: Normal heart sounds.  Pulmonary:     Effort: Pulmonary effort is normal.     Breath sounds: Normal breath sounds.  Musculoskeletal: Normal range of motion.  Skin:    Findings: No erythema.  Neurological:     Mental Status: He is alert and oriented to person, place, and time.  Psychiatric:        Behavior: Behavior normal.        Thought Content: Thought content normal.         Judgment: Judgment normal.     Results for orders placed or performed in visit on 01/04/18  CBC with Differential/Platelet  Result Value Ref Range   WBC 9.7 3.4 - 10.8 x10E3/uL   RBC 4.43 4.14 - 5.80 x10E6/uL   Hemoglobin 14.1 13.0 - 17.7 g/dL   Hematocrit 51.0 25.8 - 51.0 %   MCV 90 79 - 97 fL   MCH 31.8 26.6 - 33.0 pg   MCHC 35.3 31.5 - 35.7 g/dL   RDW 52.7 78.2 - 42.3 %   Platelets 285 150 - 450 x10E3/uL   Neutrophils 64 Not Estab. %   Lymphs 27 Not Estab. %   Monocytes 7 Not Estab. %   Eos 1 Not Estab. %   Basos 1 Not Estab. %   Neutrophils Absolute 6.2 1.4 - 7.0 x10E3/uL   Lymphocytes Absolute 2.6 0.7 - 3.1 x10E3/uL   Monocytes Absolute 0.7 0.1 - 0.9 x10E3/uL   EOS (ABSOLUTE) 0.1 0.0 - 0.4 x10E3/uL   Basophils Absolute 0.1 0.0 - 0.2 x10E3/uL   Immature Granulocytes 0 Not Estab. %   Immature Grans (Abs) 0.0 0.0 - 0.1 x10E3/uL  TSH  Result  Value Ref Range   TSH 2.030 0.450 - 4.500 uIU/mL  Vitamin D (25 hydroxy)  Result Value Ref Range   Vit D, 25-Hydroxy 26.4 (L) 30.0 - 100.0 ng/mL  Vitamin B12  Result Value Ref Range   Vitamin B-12 572 232 - 1,245 pg/mL  Lyme Ab/Western Blot Reflex  Result Value Ref Range   Lyme IgG/IgM Ab <0.91 0.00 - 0.90 ISR   LYME DISEASE AB, QUANT, IGM <0.80 0.00 - 0.79 index      Assessment & Plan:   Problem List Items Addressed This Visit      Respiratory   Asthma    Discussed use of inhalers and smoking cessation        Musculoskeletal and Integument   ACL graft tear (HCC)    Reviewed ears which are normal-looking with normal-looking graft        Other   Depression    Improved on Wellbutrin will give refills      Relevant Medications   buPROPion (WELLBUTRIN XL) 150 MG 24 hr tablet   Vitamin D deficiency    Discussed use of over-the-counter medications          Follow up plan: Return in about 3 months (around 05/18/2018) for Physical Exam.

## 2018-02-16 NOTE — Assessment & Plan Note (Signed)
Reviewed ears which are normal-looking with normal-looking graft

## 2018-02-16 NOTE — Assessment & Plan Note (Signed)
Discussed use of inhalers and smoking cessation

## 2018-02-16 NOTE — Assessment & Plan Note (Signed)
Improved on Wellbutrin will give refills

## 2018-03-29 ENCOUNTER — Other Ambulatory Visit: Payer: Self-pay | Admitting: Family Medicine

## 2018-04-25 ENCOUNTER — Ambulatory Visit (INDEPENDENT_AMBULATORY_CARE_PROVIDER_SITE_OTHER): Payer: 59 | Admitting: Family Medicine

## 2018-04-25 ENCOUNTER — Other Ambulatory Visit: Payer: Self-pay

## 2018-04-25 ENCOUNTER — Encounter: Payer: Self-pay | Admitting: Family Medicine

## 2018-04-25 DIAGNOSIS — J4541 Moderate persistent asthma with (acute) exacerbation: Secondary | ICD-10-CM

## 2018-04-25 DIAGNOSIS — L659 Nonscarring hair loss, unspecified: Secondary | ICD-10-CM

## 2018-04-25 DIAGNOSIS — F3342 Major depressive disorder, recurrent, in full remission: Secondary | ICD-10-CM

## 2018-04-25 DIAGNOSIS — F331 Major depressive disorder, recurrent, moderate: Secondary | ICD-10-CM | POA: Diagnosis not present

## 2018-04-25 MED ORDER — ALBUTEROL SULFATE HFA 108 (90 BASE) MCG/ACT IN AERS: 2.0000 | INHALATION_SPRAY | Freq: Four times a day (QID) | RESPIRATORY_TRACT | 3 refills | Status: AC | PRN

## 2018-04-25 NOTE — Assessment & Plan Note (Signed)
Discussed alopecia care and treatment

## 2018-04-25 NOTE — Progress Notes (Signed)
There were no vitals taken for this visit.   Subjective:    Patient ID: Daniel Lawson, male    DOB: 11-23-1970, 48 y.o.   MRN: [id number]  HPI: Daniel Lawson is a 48 y.o. male  Chief Complaint  Patient presents with  . Depression  . Alopecia    pt states he had a place on his head that the hair just fell out about 2 months ago    Telemedicine using audio and telecommunications for a synchronous communication visit. Today's visit due to COVID-19 isolation precautions I connected with [place] and verified that I am speaking with the correct person using two identifiers.   I discussed the limitations, risks, security and privacy concerns of performing an evaluation and management service by telephone and the availability of in person appointments. I also discussed with the patient that there may be a patient responsible charge related to this service. The patient expressed understanding and agreed to proceed. The patient's location is home I am at home.  Patient all in all doing well breathing doing okay needs more albuterol for his breathing but continues to smoke. Reviewed coronavirus with patient and patient still working discussed social distancing and importance. Patient doing well with his Symbicort has plenty of inhalers. Discussed depression which is stable on bupropion. Patient relates a couple months ago lost a quarter sized patch of hair on the side of his head which is now very slick with no hair growth.  Relevant past medical, surgical, family and social history reviewed and updated as indicated. Interim medical history since our last visit reviewed. Allergies and medications reviewed and updated.  Review of Systems  Constitutional: Negative.   Respiratory: Negative.   Cardiovascular: Negative.     Per HPI unless specifically indicated above     Objective:    There were no vitals taken for this visit.  Wt Readings from Last 3 Encounters:  02/16/18  158 lb (71.7 kg)  01/04/18 161 lb (73 kg)  04/20/17 158 lb (71.7 kg)    Physical Exam none Results for orders placed or performed in visit on 01/04/18  CBC with Differential/Platelet  Result Value Ref Range   WBC 9.7 3.4 - 10.8 x10E3/uL   RBC 4.43 4.14 - 5.80 x10E6/uL   Hemoglobin 14.1 13.0 - 17.7 g/dL   Hematocrit 86.5 78.4 - 51.0 %   MCV 90 79 - 97 fL   MCH 31.8 26.6 - 33.0 pg   MCHC 35.3 31.5 - 35.7 g/dL   RDW 69.6 29.5 - 28.4 %   Platelets 285 150 - 450 x10E3/uL   Neutrophils 64 Not Estab. %   Lymphs 27 Not Estab. %   Monocytes 7 Not Estab. %   Eos 1 Not Estab. %   Basos 1 Not Estab. %   Neutrophils Absolute 6.2 1.4 - 7.0 x10E3/uL   Lymphocytes Absolute 2.6 0.7 - 3.1 x10E3/uL   Monocytes Absolute 0.7 0.1 - 0.9 x10E3/uL   EOS (ABSOLUTE) 0.1 0.0 - 0.4 x10E3/uL   Basophils Absolute 0.1 0.0 - 0.2 x10E3/uL   Immature Granulocytes 0 Not Estab. %   Immature Grans (Abs) 0.0 0.0 - 0.1 x10E3/uL  TSH  Result Value Ref Range   TSH 2.030 0.450 - 4.500 uIU/mL  Vitamin D (25 hydroxy)  Result Value Ref Range   Vit D, 25-Hydroxy 26.4 (L) 30.0 - 100.0 ng/mL  Vitamin B12  Result Value Ref Range   Vitamin B-12 572 232 - 1,245 pg/mL  Lyme  Ab/Western Blot Reflex  Result Value Ref Range   Lyme IgG/IgM Ab <0.91 0.00 - 0.90 ISR   LYME DISEASE AB, QUANT, IGM <0.80 0.00 - 0.79 index      Assessment & Plan:   Problem List Items Addressed This Visit      Respiratory   Asthma    The current medical regimen is effective;  continue present plan and medications. Discussed smoking and smoking cessation      Relevant Medications   albuterol (PROVENTIL HFA;VENTOLIN HFA) 108 (90 Base) MCG/ACT inhaler     Musculoskeletal and Integument   Alopecia    Discussed alopecia care and treatment        Other   Major depression, recurrent (HCC)    The current medical regimen is effective;  continue present plan and medications.       RESOLVED: Depression    The current medical regimen is  effective;  continue present plan and medications.         I discussed the assessment and treatment plan with the patient. The patient was provided an opportunity to ask questions and all were answered. The patient agreed with the plan and demonstrated an understanding of the instructions.   The patient was advised to call back or seek an in-person evaluation if the symptoms worsen or if the condition fails to improve as anticipated.   I provided 21+ minutes of time during this encounter.  Follow up plan: Return in about 6 months (around 10/25/2018) for Physical Exam.

## 2018-04-25 NOTE — Assessment & Plan Note (Addendum)
The current medical regimen is effective;  continue present plan and medications. Discussed smoking and smoking cessation

## 2018-04-25 NOTE — Assessment & Plan Note (Signed)
The current medical regimen is effective;  continue present plan and medications.
